# Patient Record
Sex: Female | Born: 1975 | Race: Black or African American | Hispanic: No | Smoking: Current every day smoker
Health system: Southern US, Community
[De-identification: ages and names within clinical notes are randomized; demographics above are authoritative.]

## PROBLEM LIST (undated history)

## (undated) DIAGNOSIS — I10 Essential (primary) hypertension: Secondary | ICD-10-CM

## (undated) HISTORY — PX: HERNIA REPAIR: SHX51

---

## 2001-04-29 ENCOUNTER — Encounter: Payer: Self-pay | Admitting: *Deleted

## 2001-04-29 ENCOUNTER — Emergency Department (HOSPITAL_COMMUNITY): Admission: EM | Admit: 2001-04-29 | Discharge: 2001-04-29 | Payer: Self-pay | Admitting: *Deleted

## 2001-10-06 ENCOUNTER — Encounter: Payer: Self-pay | Admitting: Emergency Medicine

## 2001-10-06 ENCOUNTER — Emergency Department (HOSPITAL_COMMUNITY): Admission: EM | Admit: 2001-10-06 | Discharge: 2001-10-06 | Payer: Self-pay | Admitting: Emergency Medicine

## 2001-10-27 ENCOUNTER — Inpatient Hospital Stay (HOSPITAL_COMMUNITY): Admission: AD | Admit: 2001-10-27 | Discharge: 2001-10-30 | Payer: Self-pay | Admitting: Obstetrics & Gynecology

## 2001-10-28 ENCOUNTER — Encounter: Payer: Self-pay | Admitting: Obstetrics & Gynecology

## 2002-04-29 ENCOUNTER — Emergency Department (HOSPITAL_COMMUNITY): Admission: EM | Admit: 2002-04-29 | Discharge: 2002-04-29 | Payer: Self-pay | Admitting: Emergency Medicine

## 2005-08-15 ENCOUNTER — Emergency Department (HOSPITAL_COMMUNITY): Admission: EM | Admit: 2005-08-15 | Discharge: 2005-08-15 | Payer: Self-pay | Admitting: Emergency Medicine

## 2005-11-04 ENCOUNTER — Emergency Department (HOSPITAL_COMMUNITY): Admission: EM | Admit: 2005-11-04 | Discharge: 2005-11-04 | Payer: Self-pay | Admitting: Emergency Medicine

## 2005-12-01 ENCOUNTER — Emergency Department (HOSPITAL_COMMUNITY): Admission: EM | Admit: 2005-12-01 | Discharge: 2005-12-01 | Payer: Self-pay | Admitting: Emergency Medicine

## 2005-12-13 ENCOUNTER — Emergency Department (HOSPITAL_COMMUNITY): Admission: EM | Admit: 2005-12-13 | Discharge: 2005-12-14 | Payer: Self-pay | Admitting: Emergency Medicine

## 2006-05-08 ENCOUNTER — Observation Stay (HOSPITAL_COMMUNITY): Admission: RE | Admit: 2006-05-08 | Discharge: 2006-05-09 | Payer: Self-pay | Admitting: Obstetrics and Gynecology

## 2006-05-29 ENCOUNTER — Observation Stay (HOSPITAL_COMMUNITY): Admission: AD | Admit: 2006-05-29 | Discharge: 2006-05-30 | Payer: Self-pay | Admitting: Obstetrics and Gynecology

## 2006-06-17 ENCOUNTER — Inpatient Hospital Stay (HOSPITAL_COMMUNITY): Admission: RE | Admit: 2006-06-17 | Discharge: 2006-06-19 | Payer: Self-pay | Admitting: Obstetrics and Gynecology

## 2006-06-17 ENCOUNTER — Encounter (INDEPENDENT_AMBULATORY_CARE_PROVIDER_SITE_OTHER): Payer: Self-pay | Admitting: *Deleted

## 2007-02-15 ENCOUNTER — Emergency Department (HOSPITAL_COMMUNITY): Admission: EM | Admit: 2007-02-15 | Discharge: 2007-02-15 | Payer: Self-pay | Admitting: Emergency Medicine

## 2007-02-18 ENCOUNTER — Emergency Department (HOSPITAL_COMMUNITY): Admission: EM | Admit: 2007-02-18 | Discharge: 2007-02-18 | Payer: Self-pay | Admitting: Emergency Medicine

## 2008-01-10 ENCOUNTER — Emergency Department (HOSPITAL_COMMUNITY): Admission: EM | Admit: 2008-01-10 | Discharge: 2008-01-10 | Payer: Self-pay | Admitting: Emergency Medicine

## 2009-03-14 ENCOUNTER — Emergency Department (HOSPITAL_COMMUNITY): Admission: EM | Admit: 2009-03-14 | Discharge: 2009-03-14 | Payer: Self-pay | Admitting: Emergency Medicine

## 2009-07-01 ENCOUNTER — Emergency Department (HOSPITAL_COMMUNITY): Admission: EM | Admit: 2009-07-01 | Discharge: 2009-07-01 | Payer: Self-pay | Admitting: Emergency Medicine

## 2010-01-17 ENCOUNTER — Emergency Department (HOSPITAL_COMMUNITY): Admission: EM | Admit: 2010-01-17 | Discharge: 2010-01-17 | Payer: Self-pay | Admitting: Emergency Medicine

## 2010-12-19 LAB — CBC
Hemoglobin: 12.1 g/dL (ref 12.0–15.0)
RBC: 4.82 MIL/uL (ref 3.87–5.11)

## 2010-12-19 LAB — BASIC METABOLIC PANEL
CO2: 27 mEq/L (ref 19–32)
Chloride: 105 mEq/L (ref 96–112)
GFR calc Af Amer: >60 mL/min (ref >60)
Potassium: 3.8 mEq/L (ref 3.5–5.1)
Sodium: 138 mEq/L (ref 135–145)

## 2010-12-19 LAB — DIFFERENTIAL
Basophils Absolute: 0.1 10*3/uL (ref 0.0–0.1)
Eosinophils Relative: 2 % (ref 0–5)
Neutro Abs: 4.4 10*3/uL (ref 1.7–7.7)
Neutrophils Relative %: 61 % (ref 43–77)

## 2010-12-19 LAB — POCT CARDIAC MARKERS

## 2010-12-29 ENCOUNTER — Emergency Department (HOSPITAL_COMMUNITY)
Admission: EM | Admit: 2010-12-29 | Discharge: 2010-12-29 | Disposition: A | Discharge status: Home / Self Care | Payer: 59 | Attending: Emergency Medicine | Admitting: Emergency Medicine

## 2010-12-29 ENCOUNTER — Emergency Department (HOSPITAL_COMMUNITY): Payer: 59

## 2010-12-29 DIAGNOSIS — R0602 Shortness of breath: Secondary | ICD-10-CM | POA: Insufficient documentation

## 2010-12-29 DIAGNOSIS — J189 Pneumonia, unspecified organism: Secondary | ICD-10-CM | POA: Insufficient documentation

## 2010-12-29 DIAGNOSIS — R112 Nausea with vomiting, unspecified: Secondary | ICD-10-CM | POA: Insufficient documentation

## 2010-12-29 DIAGNOSIS — R071 Chest pain on breathing: Secondary | ICD-10-CM | POA: Insufficient documentation

## 2010-12-29 DIAGNOSIS — IMO0001 Reserved for inherently not codable concepts without codable children: Secondary | ICD-10-CM | POA: Insufficient documentation

## 2010-12-29 LAB — DIFFERENTIAL
Lymphs Abs: 1.4 10*3/uL (ref 0.7–4.0)
Monocytes Absolute: 1.9 10*3/uL — ABNORMAL HIGH (ref 0.1–1.0)
Monocytes Relative: 13 % — ABNORMAL HIGH (ref 3–12)
Neutro Abs: 11.5 10*3/uL — ABNORMAL HIGH (ref 1.7–7.7)
Neutrophils Relative %: 78 % — ABNORMAL HIGH (ref 43–77)

## 2010-12-29 LAB — BASIC METABOLIC PANEL
Chloride: 102 mEq/L (ref 96–112)
GFR calc Af Amer: >60 mL/min (ref >60)
Sodium: 134 mEq/L — ABNORMAL LOW (ref 135–145)

## 2010-12-29 LAB — POCT CARDIAC MARKERS: Myoglobin, poc: 28.4 ng/mL (ref 12–200)

## 2010-12-29 LAB — CBC
HCT: 34.1 % — ABNORMAL LOW (ref 36.0–46.0)
Hemoglobin: 11.1 g/dL — ABNORMAL LOW (ref 12.0–15.0)
MCH: 24.5 pg — ABNORMAL LOW (ref 26.0–34.0)
MCHC: 32.6 g/dL (ref 30.0–36.0)
MCV: 75.3 fL — ABNORMAL LOW (ref 78.0–100.0)
RDW: 13.8 % (ref 11.5–15.5)

## 2010-12-29 MED ORDER — IOHEXOL 300 MG/ML  SOLN
80.0000 mL | Freq: Once | INTRAMUSCULAR | Status: AC | PRN
  Administered 2010-12-29: 80 mL via INTRAVENOUS

## 2010-12-30 ENCOUNTER — Emergency Department (HOSPITAL_COMMUNITY)
Admission: EM | Admit: 2010-12-30 | Discharge: 2010-12-30 | Disposition: A | Discharge status: Home / Self Care | Payer: 59 | Attending: Emergency Medicine | Admitting: Emergency Medicine

## 2010-12-30 DIAGNOSIS — J189 Pneumonia, unspecified organism: Secondary | ICD-10-CM | POA: Insufficient documentation

## 2010-12-30 DIAGNOSIS — IMO0001 Reserved for inherently not codable concepts without codable children: Secondary | ICD-10-CM | POA: Insufficient documentation

## 2010-12-30 DIAGNOSIS — R509 Fever, unspecified: Secondary | ICD-10-CM | POA: Insufficient documentation

## 2011-01-01 ENCOUNTER — Telehealth: Payer: Self-pay | Admitting: Family Medicine

## 2011-01-01 NOTE — Telephone Encounter (Signed)
Mom called because she is worried about her daughter.  Has had persistent fevers since Thursday.  Taken to [facility] ED on Friday, diagnosed with PNA by CXR and confirmed with CT scan.  Given Rocephin in ED, prescribed Z-pack and sent home.  Fevers to 103 in ED. Fevers persisted and patient returned to ED Sat night.  Given IV fluids, morphine, and passed PO fluid challenge, so sent home.   More fevers last night, Mother still worried, took patient to [facility] in [place] where she was given more IV fluids, another unknown Abx via IV, and sent home b/c she could tolerate PO.  Given Phenergan for nausea. Mom states that today patient has been dry-heaving, but able to tolerate fluids and some potatoes to eat.   Mom still concerned b/c Fevers have not abated with anti-pyretics (Ibuprofen and Tylenol po at home) and Abx.   As patient is now tolerating po food and liquids, recommended she does not need to be seen in ED but should be seen in clinic first thing tomorrow AM.   Discussed warnings/red flags, if patient cannot tolerate po intake she needs to come to ED to be assessed.   Mom agreed and appreciated input.  Will call if further concerns, plans to call clinic first thing in AM.    * On further record review, I am note sure that we have actually seen this patient in Clinic.  Mother states she is a patient of Dr. Martin Majestic, but I cannot find her name in Epic or looking back in [facility] either.  Will alert admin team to this.

## 2011-01-04 ENCOUNTER — Other Ambulatory Visit: Payer: Self-pay | Admitting: Family Medicine

## 2011-01-08 LAB — URINALYSIS, ROUTINE W REFLEX MICROSCOPIC
Bilirubin Urine: NEGATIVE
Nitrite: NEGATIVE
Protein, ur: NEGATIVE mg/dL
Urobilinogen, UA: 0.2 mg/dL (ref 0.0–1.0)

## 2011-01-08 LAB — URINE MICROSCOPIC-ADD ON

## 2011-02-16 NOTE — H&P (Signed)
[facility]  Patient:    Katherine Ward, Katherine Ward Visit Number: [id number] MRN: [id number]          Service Type: OBS Location: 4A A427 01 Attending Physician:  Lazaro Arms Dictated by:   Duane Lope, M.D. Admit Date:  10/27/2001                           History and Physical  DATE OF BIRTH:  09-Aug-1976  HISTORY OF PRESENT ILLNESS:  The patient is a 35 year old African-American female, gravida 3, para 2, abortus 1, with last menstrual period of October 04, 2001, on no form of birth control.  She is admitted to the hospital for intravenous antibiotics for pelvic inflammatory disease.  The patient originally presented to the ER on October 06, 2001, complaining of lower abdominal pain, mostly on the right, no real fever, no nausea and vomiting. An ultrasound was performed which revealed a mass in the right adnexa which measured 1.8 x 1.4 x 2.1 cm, and the thought was this could represent a hydrosalpinx.  There was also some free fluid in the right adnexal region in the cul-de-sac.  Her pelvic ultrasound was otherwise normal.  She was seen in the office by Dr. Emelda Fear on the same day and, at this point she had already had a ______ probe done.  She had a chlamydia and gonorrhea negative. Another one was performed in the office, and that was also negative.  She had some improvement in her pain since then but has basically carried on since that time with pain.  Her white count was 13,600, with 80% neutrophils, with no bands.  The rest of her laboratory data was normal.  Dr. Emelda Fear saw her a couple of days later on October 10, 2001, and she had gotten some better.  She had a repeat ultrasound here in the office, and there were no abnormal findings on ultrasound, and she was afebrile.  Beginning last Thursday, the patient had a significant increase in her pain, and she has also been running some fever.  She comes into the office today partially bent over, walking  very slowly, with an elevated temperature of 99.7.  Urinalysis is contaminated, and no other laboratories are drawn.  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  PAST MEDICAL HISTORY:  Negative.  PAST SURGICAL HISTORY:  C-section in 1995, with successful VBAC in 1997.  MEDICATIONS:  None.  ALLERGIES:  None.  REVIEW OF SYSTEMS:  Noncontributory except as above.  FAMILY HISTORY:  Noncontributory.  PHYSICAL EXAMINATION:  VITAL SIGNS:  Weight 146 pounds.  Blood pressure 110/80.  HEENT:  Unremarkable.  NECK:  Normal thyroid.  LUNGS:  Clear.  HEART:  Regular rate and rhythm.  Without murmur, regurgitation, or gallop.  BREASTS:  Deferred.  ABDOMEN:  Reveals tenderness to palpation with involuntary guarding in the right lower quadrant.  Negative rebound, but she does have tenderness also in the right upper quadrant and the right flank with voluntary and involuntary guarding.  The left adnexa is basically negative.  PELVIC:  Reveals a fullness in the right side and quite tender.  There is cervical motion tenderness, and the left adnexa is also benign.  EXTREMITIES:  Warm.  No edema.  NEUROLOGIC:  Grossly intact.  IMPRESSION: 1. Pelvic inflammatory disease. 2. Possible tubo-ovarian abscess/complex. 3. 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  PLAN:  The patient is admitted for IV antibiotics, hydration.  She will get a CT scan of the abdomen and pelvis tomorrow.  She understands that there is a possibility that, in the next 72 hours or so if she does not respond, she may need to have a laparoscopy performed.  She was started on Rocephin and doxycycline and given Darvocet for pain. Dictated by:   Duane Lope, M.D. Attending Physician:  Lazaro Arms DD:  10/27/01 TD:  10/27/01 Job: [id number]

## 2011-02-16 NOTE — Discharge Summary (Signed)
NAME:  Katherine Ward, Katherine Ward                  ACCOUNT NO.:  [id number]   MEDICAL RECORD NO.:  [id number]          PATIENT TYPE:  INP   LOCATION:  A413                          FACILITY:  [facility]   PHYSICIAN:  Lazaro Arms, M.D.   DATE OF BIRTH:  [birth year]   DATE OF ADMISSION:  06/17/2006  DATE OF DISCHARGE:  09/19/2007LH                                 DISCHARGE SUMMARY   DISCHARGE DIAGNOSES:  1. Status post repeat cesarean section.  2. Tubal ligation.  3. Unremarkable postoperative course.   PROCEDURE:  Repeat cesarean section and tubal ligation.   HISTORY OF PRESENT ILLNESS:  Please refer to the transcribed History and  Physical on antepartum chart for details on admission to the hospital.   HOSPITAL COURSE:  The patient was admitted for C-section on September 17.  Postoperatively, she did well.  She had clear liquids and regular diet.  She  voided without symptoms and was ambulatory.  She had progression of normal  bowel function.  Her abdominal exam was benign.  Her incisions were clean,  dry and intact.  Her postop day #1 hemoglobin and hematocrit was 10.5 and  31.7 which was appropriate for her preop level.  She was discharged to home  on the morning of June 19, 2006, in good and stable condition.   DISCHARGE MEDICATIONS:  Tylenol No. 3 elixir as needed.   FOLLOW UP:  She will follow up in the office next Monday to have her staples  removed.      Lazaro Arms, M.D.  Electronically Signed     LHE/MEDQ  D:  06/19/2006  T:  06/20/2006  Job:  [id number]

## 2011-02-16 NOTE — Discharge Summary (Signed)
[facility]  Patient:    Katherine Ward, Katherine Ward Visit Number: [id number] MRN: [id number]          Service Type: OBS Location: 4A A427 01 Attending Physician:  Lazaro Arms Admit Date:  10/27/2001 Discharge Date: 10/30/2001                             Discharge Summary  DISCHARGE DIAGNOSES: 1. Pelvic inflammatory disease. 2. 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  PROCEDURES: 1. Admission to the hospital. 2. Intravenous antibiotics. 3. Discharge from hospital.  HISTORY OF PRESENT ILLNESS:  Please refer to the transcribed history and physical for details of admission to the hospital.  HOSPITAL COURSE:  Patient was admitted and immediately spiked a temperature to 101.5 degrees Fahrenheit.  She had a relatively unimpressive white count at 9100, but a significant left shift.  She underwent a CT scan of the abdomen and pelvis the day after admission.  She was, of course, started on Rocephin and doxycycline at the time of admission.  She was found to have what appears to be a chronic, judging by the ultrasound she had on January 6, a hydrosalpinx on the right and she had some obliteration of the fat planes on the right side.  The left adnexa appeared to be normal.  She also had questionable changes consistent with pyelonephritis which she has had in the past.  Urine culture at the time of discharge is negative and examination revealed no CVA tenderness.  The patient, after about 36 hours, felt significantly better.  I was concerned at the time of admission that she may require laparoscopic evaluation and possibly removal of the adnexa when I admitted her because of her marked tenderness and she did have some rebound in the hospital but after about 36 hours her pain was significantly better and her white count came down to 7600.  She also got her appetite back and ate without difficulty, ambulated without difficulty, and she required really no pain medicine.  She got  a total of 72 hours of IV antibiotics.  She was discharged to home after having received p.o. doxycycline and did well and Rocephin 1 g intramuscularly and she did well with that.  She was discharged to home on doxycycline 100 mg b.i.d. and she will be seen in the office next Wednesday for follow-up.  The only prescription she was given was Chromagen for her slight iron deficiency anemia and her doxycycline. Attending Physician:  Lazaro Arms DD:  10/30/01 TD:  10/30/01 Job: [id number]

## 2011-02-16 NOTE — H&P (Signed)
NAME:  Katherine Ward, Katherine Ward                  ACCOUNT NO.:  [id number]   MEDICAL RECORD NO.:  [id number]          PATIENT TYPE:  OIB   LOCATION:  A415                          FACILITY:  [facility]   PHYSICIAN:  Tilda Burrow, M.D. DATE OF BIRTH:  09/20/1976   DATE OF ADMISSION:  05/08/2006  DATE OF DISCHARGE:  LH                                HISTORY & PHYSICAL   HISTORY OF PRESENT ILLNESS:  Katherine Ward is a 35 year old gravida 3, para 2, [facility]  September 26 at 30-weeks' gestation with right flank pain and suprapubic  pressure.   HISTORY OF PRESENT ILLNESS:  Katherine Ward became very uncomfortable at home. She  presents to the hospital rating her pain on a scale of 1/10.   MEDICAL HISTORY:  Positive for abnormal Pap and a heart murmur.   SOCIAL HISTORY:  Positive for cesarean section and a hernia repair.   ALLERGIES:  She has no known allergies.   PRENATAL COURSE:  Has been essentially uneventful. Blood type is O positive,  EDS is positive for THC, rubella is equivocal. Hepatitis B surface antigen  negative. HIV is negative. Serology is nonreactive. Pap normal. AFP within  normal limits.   PHYSICAL EXAMINATION:  She urinates clear with the exception she has blood  in her urine. Cervix is closed. There are no contractions noted on the  monitor.   PLAN:  We are going to get a renal ultrasound, strain all urine, consult Dr.  Emelda Fear and IV pain management until results are back from renal  ultrasound.      Katherine Ward, Katherine Ward      Tilda Burrow, M.D.  Electronically Signed    DL/MEDQ  D:  25/36/6440  T:  05/08/2006  Job:  [id number]

## 2011-02-16 NOTE — Op Note (Signed)
NAME:  Katherine Ward, Katherine Ward                  ACCOUNT NO.:  [id number]   MEDICAL RECORD NO.:  [id number]          PATIENT TYPE:  INP   LOCATION:  A413                          FACILITY:  [facility]   PHYSICIAN:  Tilda Burrow, M.D. DATE OF BIRTH:  June 10, 1976   DATE OF PROCEDURE:  06/17/2006  DATE OF DISCHARGE:                                 OPERATIVE REPORT   PREOPERATIVE DIAGNOSES:  1. Pregnancy at 38+ weeks gestation.  2. Repeat cesarean section after trial of labor.  3. Elective permanent sterilization.   POSTOPERATIVE DIAGNOSES:  1. Pregnancy at 38+ weeks gestation.  2. Repeat cesarean section after trial of labor.  3. Elective permanent sterilization.   PROCEDURES:  1. Repeat low transverse cesarean section.  2. Bilateral partial salpingectomy.   SURGEON:  Tilda Burrow, M.D.   ASSISTANT:  None.   ANESTHESIA:  Spinal anesthesia.   COMPLICATIONS:  None.   FINDINGS:  Healthy female infant, Apgars 9 and 9, weight__________.   INDICATIONS FOR PROCEDURE:  A 35 year old multiparous female, desiring  repeat C. section and tubal sterilization.   DESCRIPTION OF PROCEDURE:  Spinal anesthesia introduced by Glynn Octave in  uncomplicated fashion.  Patient was somewhat anxious but passed the Allis  clamp test.  Pfannenstiel incision repeated easily with easy entry into the  peritoneal cavity.  The bladder flap was developed.  The lower uterine  segment was incredibly thin, 2-3 mm thickness.  Transverse incision extended  laterally using index finger retraction and fetal vertex guided through the  incision.  A small, healthy, active, energetic baby.  Amniotic fluid was  normal in volume and color.  Cord blood samples were obtained and are  pending and located elsewhere in the chart.  Apgars 9 and 9 were assigned.  See Dr. Samul Dada notes for details.  Placenta delivered intact, [name]  presentation, there were some membrane remnants identified and extracted.  The uterus otherwise  extremely hemostatic.  Single layer of running locking  closure of 2-0 chromic with a single figure-of-eight suture on the left side  of the incision gave good hemostasis and then bladder flap was closed with  running 2-0 chromic.  The abdomen was irrigated with antibiotic solution.   TUBAL LIGATION:  Tubal ligation was performed by grasping a mid segment  knuckle of tube, doubly ligating around its base and then incising the  incarcerated segment of tube for histology and hemostasis was again  excellent.  The 2-0 chromic closure of the peritoneum was followed by  trimming of the old cicatrix off of the incision, removing a 1 to 1-1/2 inch  wide strip of skin and fibrous tissue and fatty tissue.  The fascia was  trimmed slightly as well in the midline to improve lower abdominal contours.  Fascia had been closed post trimming with 0 Vicryl in a running fashion.  Subcutaneous closure was with interrupted 2-0 plain and staple closure of  the skin completed the procedure.   ESTIMATED BLOOD LOSS:  Quite low at 250 mL.      Tilda Burrow, M.D.  Electronically Signed  JVF/MEDQ  D:  06/17/2006  T:  06/17/2006  Job:  [id number]   cc:   [facility]   Jeoffrey Massed, MD  Fax: [phone]

## 2011-02-16 NOTE — H&P (Signed)
NAME:  Katherine Ward, Katherine Ward                  ACCOUNT NO.:  [id number]   MEDICAL RECORD NO.:  [id number]          PATIENT TYPE:  AMB   LOCATION:  DAY                           FACILITY:  [facility]   PHYSICIAN:  Tilda Burrow, M.D. DATE OF BIRTH:  June 01, 1976   DATE OF ADMISSION:  06/17/2006  DATE OF DISCHARGE:  LH                                HISTORY & PHYSICAL   ADMITTING DIAGNOSES:  1. Pregnancy 38 weeks 5 days.  2. Repeat cesarean section.  3. Not for trial of labor.  4. Desire for elective permanent sterilization.   HPI:  This 35 year old female gravida 3, para 2, prior cesarean section x1  and a subsequent vaginal birth 1997, by Dr. Gilford Silvius, is admitted for repeat  cesarean section and tubal ligation.  She has been followed through our  office with an uneventful prenatal course through 14 prenatal visits,  appropriate weight gain and fundal height.  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  She plans  permanent sterilization.   PAST MEDICAL HISTORY:  Benign.   SURGICAL HISTORY:  1. C-section.  2. Umbilical hernia repair.   PRENATAL COURSE:  Was notable for questionable fetal thoracic spine on  screening ultrasound.  Referred to [facility] for level 2 ultrasound,  targeted level 2 ultrasound, which was reported as negative Feb 06, 2006.   GENERAL EXAM:  She is a healthy, slim African-American female.  Height 5  feet 6 inches, weight 197, blood pressure 124/64, fundal height 38 cm,  estimated fetal weight 7.5 pounds, cervix closed, long and high at last  check May 15, 2006.   PLAN:  Repeat C-section, tubal ligation June 17, 2006.      Tilda Burrow, M.D.  Electronically Signed     JVF/MEDQ  D:   06/11/2006  T:  06/11/2006  Job:  [id number]   cc:   [facility]  [address], [address]  [place], [place] [address]   Francoise Schaumann. Milford Cage DO, FAAP  Fax: [phone]

## 2011-02-16 NOTE — Discharge Summary (Signed)
NAME:  Ward, Katherine                  ACCOUNT NO.:  [id number]   MEDICAL RECORD NO.:  [id number]          PATIENT TYPE:  OIB   LOCATION:  LDR2                          FACILITY:  [facility]   PHYSICIAN:  Tilda Burrow, M.D. DATE OF BIRTH:  1976/07/02   DATE OF ADMISSION:  05/29/2006  DATE OF DISCHARGE:  08/30/2007LH                                 DISCHARGE SUMMARY   REASON FOR ADMISSION:  [name] is a 35 year old patient with previous C-  section who is in with lower abdominal pain and cramping.   PAST MEDICAL HISTORY:  Medical history is positive for ovarian cyst.   PAST SURGICAL HISTORY:  Positive for C-section and umbilical hernia repair.   PRENATAL COURSE:  Essentially uneventful.  Blood type is A positive.  Rubella is equivocal.  Hepatitis B surface antigen is negative.  HIV is  negative.  _rubella_ is positive.  Serology is nonreactive.  Pap normal.  ________ normal.  Hemoglobin 10.1, hematocrit 32.8.  One hour glucose is 92.   PHYSICAL EXAMINATION:  Vital signs are stable.  Fetal heart regular and  stable.  Amniostat is negative.   HOSPITAL COURSE:  There has been no leaking noted since admission last  night.  She is having an occasional isolated contraction.  Dr. Carey Bullocks was  in to evaluate the patient and deem the patient ready for discharge home.      [name], [name]      Tilda Burrow, M.D.  Electronically Signed    DL/MEDQ  D:  56/43/3295  T:  05/30/2006  Job:  [id number]

## 2011-06-26 LAB — BASIC METABOLIC PANEL
Calcium: 9
Chloride: 109
Creatinine, Ser: 0.92
GFR calc Af Amer: >60
GFR calc non Af Amer: >60

## 2011-06-26 LAB — CBC
Platelets: 184
RBC: 5.09
WBC: 10.1

## 2011-06-26 LAB — URINE MICROSCOPIC-ADD ON

## 2011-06-26 LAB — URINALYSIS, ROUTINE W REFLEX MICROSCOPIC
Glucose, UA: NEGATIVE
Protein, ur: 100 — AB
pH: 6

## 2011-06-26 LAB — DIFFERENTIAL
Lymphocytes Relative: 28
Lymphs Abs: 2.9
Monocytes Relative: 8
Neutro Abs: 6.1
Neutrophils Relative %: 61

## 2011-06-26 LAB — PREGNANCY, URINE: Preg Test, Ur: NEGATIVE

## 2011-09-14 ENCOUNTER — Encounter: Payer: Self-pay | Admitting: *Deleted

## 2011-09-14 ENCOUNTER — Emergency Department (HOSPITAL_COMMUNITY)
Admission: EM | Admit: 2011-09-14 | Discharge: 2011-09-14 | Disposition: A | Discharge status: Home / Self Care | Payer: Managed Care, Other (non HMO) | Attending: Emergency Medicine | Admitting: Emergency Medicine

## 2011-09-14 DIAGNOSIS — R6883 Chills (without fever): Secondary | ICD-10-CM | POA: Insufficient documentation

## 2011-09-14 DIAGNOSIS — R110 Nausea: Secondary | ICD-10-CM | POA: Insufficient documentation

## 2011-09-14 DIAGNOSIS — R55 Syncope and collapse: Secondary | ICD-10-CM | POA: Insufficient documentation

## 2011-09-14 DIAGNOSIS — K429 Umbilical hernia without obstruction or gangrene: Secondary | ICD-10-CM | POA: Insufficient documentation

## 2011-09-14 DIAGNOSIS — I10 Essential (primary) hypertension: Secondary | ICD-10-CM | POA: Insufficient documentation

## 2011-09-14 DIAGNOSIS — F17200 Nicotine dependence, unspecified, uncomplicated: Secondary | ICD-10-CM | POA: Insufficient documentation

## 2011-09-14 HISTORY — DX: Essential (primary) hypertension: I10

## 2011-09-14 LAB — COMPREHENSIVE METABOLIC PANEL
ALT: 9 U/L (ref 0–35)
Alkaline Phosphatase: 61 U/L (ref 39–117)
BUN: 4 mg/dL — ABNORMAL LOW (ref 6–23)
Chloride: 100 mEq/L (ref 96–112)
GFR calc Af Amer: 87 mL/min — ABNORMAL LOW (ref >90)
Glucose, Bld: 83 mg/dL (ref 70–99)
Potassium: 3.5 mEq/L (ref 3.5–5.1)
Sodium: 136 mEq/L (ref 135–145)
Total Bilirubin: 0.5 mg/dL (ref 0.3–1.2)

## 2011-09-14 LAB — CBC
HCT: 41.4 % (ref 36.0–46.0)
MCHC: 32.1 g/dL (ref 30.0–36.0)
MCV: 76.8 fL — ABNORMAL LOW (ref 78.0–100.0)
Platelets: 245 10*3/uL (ref 150–400)
RDW: 14.6 % (ref 11.5–15.5)
WBC: 9.8 10*3/uL (ref 4.0–10.5)

## 2011-09-14 LAB — DIFFERENTIAL
Basophils Absolute: 0 10*3/uL (ref 0.0–0.1)
Basophils Relative: 0 % (ref 0–1)
Eosinophils Relative: 1 % (ref 0–5)
Lymphocytes Relative: 20 % (ref 12–46)
Monocytes Absolute: 0.9 10*3/uL (ref 0.1–1.0)
Neutro Abs: 6.9 10*3/uL (ref 1.7–7.7)

## 2011-09-14 MED ORDER — LORAZEPAM 2 MG/ML IJ SOLN
1.0000 mg | Freq: Once | INTRAMUSCULAR | Status: AC
  Administered 2011-09-14: 1 mg via INTRAVENOUS
  Filled 2011-09-14: qty 1

## 2011-09-14 MED ORDER — HYDROMORPHONE HCL PF 1 MG/ML IJ SOLN
1.0000 mg | Freq: Once | INTRAMUSCULAR | Status: AC
  Administered 2011-09-14: 1 mg via INTRAVENOUS
  Filled 2011-09-14: qty 1

## 2011-09-14 MED ORDER — SODIUM CHLORIDE 0.9 % IV SOLN
Freq: Once | INTRAVENOUS | Status: AC
  Administered 2011-09-14: 125 mL/h via INTRAVENOUS

## 2011-09-14 MED ORDER — ONDANSETRON HCL 4 MG/2ML IJ SOLN
4.0000 mg | Freq: Once | INTRAMUSCULAR | Status: AC
  Administered 2011-09-14: 4 mg via INTRAVENOUS
  Filled 2011-09-14: qty 2

## 2011-09-14 NOTE — ED Notes (Signed)
C/o epigastric pain that started yesterday.  Denies v/d, c/o nausea.  Pt has umbilical hernia.  Pt also states has sharp pains that shoot to chest intermittently.  States she had syncopal episode last night while at work but does not remember.

## 2011-09-14 NOTE — ED Provider Notes (Signed)
History   This chart was scribed for Katherine Lennert, MD by Clarita Crane. The patient was seen in room APA06/APA06 and the patient's care was started at 9:50AM.   CSN: [id number] Arrival date & time: 09/14/2011  9:26 AM   First MD Initiated Contact with Patient 09/14/11 0946      Chief Complaint  Patient presents with  . Abdominal Pain    (Consider location/radiation/quality/duration/timing/severity/associated sxs/prior treatment) HPI Katherine Ward is a 35 y.o. female who presents to the Emergency Department complaining of constant severe pain in the umbilical region since yesterday. Patient reports having a bulge in the umbilical region since yesterday. She has associated nausea, chills, and reportedly passed out at work yesterday. She denies vomiting and diarrhea. Patient has a h/o a hernia for the past year, which was surgical repaired previously.   Past Medical History  Diagnosis Date  . Hypertension     Past Surgical History  Procedure Date  . Hernia repair   . Cesarean section     No family history on file.  History  Substance Use Topics  . Smoking status: Current Everyday Smoker    Types: Cigarettes  . Smokeless tobacco: Not on file  . Alcohol Use: No    OB History    Grav Para Term Preterm Abortions TAB SAB Ect Mult Living                  Review of Systems  Constitutional: Positive for chills. Negative for fever and fatigue.  HENT: Negative for congestion, sinus pressure and ear discharge.   Eyes: Negative for discharge.  Respiratory: Negative for cough.   Cardiovascular: Negative for chest pain.  Gastrointestinal: Positive for nausea and abdominal pain. Negative for vomiting and diarrhea.  Genitourinary: Negative for frequency and hematuria.  Musculoskeletal: Negative for back pain.  Skin: Negative for rash.  Neurological: Negative for seizures and headaches.  Hematological: Negative.   Psychiatric/Behavioral: Negative for hallucinations.     Allergies  Review of patient's allergies indicates no known allergies.  Home Medications   Current Outpatient Rx  Name Route Sig Dispense Refill  . LISINOPRIL 20 MG PO TABS Oral Take 20 mg by mouth daily.        BP 110/78  Pulse 54  Temp(Src) 98.4 F (36.9 C) (Oral)  Resp 20  Ht 5\' 4"  (1.626 m)  Wt 169 lb (76.658 kg)  BMI 29.01 kg/m2  SpO2 97%  LMP 08/29/2011  Physical Exam  Nursing note and vitals reviewed. Constitutional: She is oriented to person, place, and time. She appears well-developed and well-nourished. She appears distressed (moderate).  HENT:  Head: Normocephalic and atraumatic.  Eyes: EOM are normal. Pupils are equal, round, and reactive to light.  Neck: Neck supple. No tracheal deviation present.  Cardiovascular: Normal rate, regular rhythm and normal heart sounds.  Exam reveals no gallop and no friction rub.   No murmur heard. Pulmonary/Chest: Effort normal. No respiratory distress.  Abdominal: Soft. She exhibits no distension. There is tenderness (tender to palpatation ).       2 cm in diameter hernia in umbilical region, not easily reducible  Musculoskeletal: Normal range of motion. She exhibits no edema.  Neurological: She is alert and oriented to person, place, and time. No sensory deficit.  Skin: Skin is warm and dry.  Psychiatric: She has a normal mood and affect. Her behavior is normal.    ED Course  Procedures (including critical care time)  DIAGNOSTIC STUDIES: Oxygen Saturation is 100%  on room air, normal by my interpretation.    COORDINATION OF CARE:  10:25am: Recheck-reduced umbilical hernia after sedation    Hernia was reduced with manuel pressure after sedation given.   11:30am: Recheck: minimally tender to periumbilcal region  Labs Reviewed  COMPREHENSIVE METABOLIC PANEL - Abnormal; Notable for the following:    BUN 4 (*)    GFR calc non Af Amer 75 (*)    GFR calc Af Amer 87 (*)    All other components within normal limits   CBC - Abnormal; Notable for the following:    RBC 5.39 (*)    MCV 76.8 (*)    MCH 24.7 (*)    All other components within normal limits  DIFFERENTIAL   No results found. Dr zieglar stated he will see the pt next week  1. Umbilical hernia       MDM     The chart was scribed for me under my direct supervision.  I personally performed the history, physical, and medical decision making and all procedures in the evaluation of this patient.Katherine Lennert, MD 09/14/11 [phone]

## 2012-01-29 ENCOUNTER — Emergency Department (HOSPITAL_COMMUNITY): Payer: Managed Care, Other (non HMO)

## 2012-01-29 ENCOUNTER — Encounter (HOSPITAL_COMMUNITY): Payer: Self-pay

## 2012-01-29 ENCOUNTER — Other Ambulatory Visit: Payer: Self-pay

## 2012-01-29 ENCOUNTER — Emergency Department (HOSPITAL_COMMUNITY)
Admission: EM | Admit: 2012-01-29 | Discharge: 2012-01-29 | Disposition: A | Discharge status: Home / Self Care | Payer: Managed Care, Other (non HMO) | Attending: Emergency Medicine | Admitting: Emergency Medicine

## 2012-01-29 DIAGNOSIS — R079 Chest pain, unspecified: Secondary | ICD-10-CM | POA: Insufficient documentation

## 2012-01-29 LAB — TROPONIN I: Troponin I: <0.3 ng/mL (ref <0.30)

## 2012-01-29 LAB — D-DIMER, QUANTITATIVE (NOT AT ARMC): D-Dimer, Quant: 0.35 ug/mL-FEU (ref 0.00–0.48)

## 2012-01-29 MED ORDER — ASPIRIN 81 MG PO CHEW
324.0000 mg | CHEWABLE_TABLET | Freq: Once | ORAL | Status: AC
  Administered 2012-01-29: 324 mg via ORAL
  Filled 2012-01-29: qty 4

## 2012-01-29 MED ORDER — SODIUM CHLORIDE 0.9 % IV BOLUS (SEPSIS)
1000.0000 mL | Freq: Once | INTRAVENOUS | Status: AC
  Administered 2012-01-29: 1000 mL via INTRAVENOUS

## 2012-01-29 MED ORDER — KETOROLAC TROMETHAMINE 30 MG/ML IJ SOLN
15.0000 mg | Freq: Once | INTRAMUSCULAR | Status: AC
  Administered 2012-01-29: 15 mg via INTRAVENOUS
  Filled 2012-01-29: qty 1

## 2012-01-29 NOTE — ED Notes (Signed)
Patient with no complaints at this time. Respirations even and unlabored. Skin warm/dry. Discharge instructions reviewed with patient at this time. Patient given opportunity to voice concerns/ask questions. IV removed per policy and band-aid applied to site. Patient discharged at this time and left Emergency Department with steady gait.  

## 2012-01-29 NOTE — ED Notes (Signed)
Pt reports cp that started last night around 12am, pain worse w/ movement. Sob at times, denies any n/v or cough.  Tearful in exam room

## 2012-01-29 NOTE — Discharge Instructions (Signed)
Chest Pain (Nonspecific) It is often hard to give a specific diagnosis for the cause of chest pain. There is always a chance that your pain could be related to something serious, such as a heart attack or a blood clot in the lungs. You need to follow up with your caregiver for further evaluation. CAUSES   Heartburn.   Pneumonia or bronchitis.   Anxiety or stress.   Inflammation around your heart (pericarditis) or lung (pleuritis or pleurisy).   A blood clot in the lung.   A collapsed lung (pneumothorax). It can develop suddenly on its own (spontaneous pneumothorax) or from injury (trauma) to the chest.   Shingles infection (herpes zoster virus).  The chest wall is composed of bones, muscles, and cartilage. Any of these can be the source of the pain.  The bones can be bruised by injury.   The muscles or cartilage can be strained by coughing or overwork.   The cartilage can be affected by inflammation and become sore (costochondritis).  DIAGNOSIS  Lab tests or other studies, such as X-rays, electrocardiography, stress testing, or cardiac imaging, may be needed to find the cause of your pain.  TREATMENT   Treatment depends on what may be causing your chest pain. Treatment may include:   Acid blockers for heartburn.   Anti-inflammatory medicine.   Pain medicine for inflammatory conditions.   Antibiotics if an infection is present.   You may be advised to change lifestyle habits. This includes stopping smoking and avoiding alcohol, caffeine, and chocolate.   You may be advised to keep your head raised (elevated) when sleeping. This reduces the chance of acid going backward from your stomach into your esophagus.   Most of the time, nonspecific chest pain will improve within 2 to 3 days with rest and mild pain medicine.  HOME CARE INSTRUCTIONS   If antibiotics were prescribed, take your antibiotics as directed. Finish them even if you start to feel better.   For the next few  days, avoid physical activities that bring on chest pain. Continue physical activities as directed.   Do not smoke.   Avoid drinking alcohol.   Only take over-the-counter or prescription medicine for pain, discomfort, or fever as directed by your caregiver.   Follow your caregiver's suggestions for further testing if your chest pain does not go away.   Keep any follow-up appointments you made. If you do not go to an appointment, you could develop lasting (chronic) problems with pain. If there is any problem keeping an appointment, you must call to reschedule.  SEEK MEDICAL CARE IF:   You think you are having problems from the medicine you are taking. Read your medicine instructions carefully.   Your chest pain does not go away, even after treatment.   You develop a rash with blisters on your chest.  SEEK IMMEDIATE MEDICAL CARE IF:   You have increased chest pain or pain that spreads to your arm, neck, jaw, back, or abdomen.   You develop shortness of breath, an increasing cough, or you are coughing up blood.   You have severe back or abdominal pain, feel nauseous, or vomit.   You develop severe weakness, fainting, or chills.   You have a fever.  THIS IS AN EMERGENCY. Do not wait to see if the pain will go away. Get medical help at once. Call your local emergency services (911 in [place] Do not drive yourself to the hospital. MAKE SURE YOU:   Understand these instructions.     Will watch your condition.   Will get help right away if you are not doing well or get worse.  Document Released: 06/27/2005 Document Revised: 09/06/2011 Document Reviewed: 04/22/2008 ExitCare Patient Information 2012 [employer]  RESOURCE GUIDE  Dental Problems  Patients with Medicaid: [facility]                     [facility] [address]                                           [address] Phone:  [phone]                                                   Phone:  [phone]  If unable to pay or uninsured, contact:  [facility] or [facility] to become qualified for the adult dental clinic.  Chronic Pain Problems Contact [facility]  [phone] Patients need to be referred by their primary care doctor.  Insufficient Money for Medicine Contact [employer]:  call "211" or [facility] 271-5999.  No Primary Care Doctor Call [facility]  832-8000 Other agencies that provide inexpensive medical care    [facility]  [phone]    [facility]  [phone]    [facility]  [phone]    [facility]  [phone]    [facility]  [phone]    [facility]  [phone]  [facility]  [phone] [facility]  [phone] [facility]   800 853-5163 (emergency services 641-4993)  Substance Abuse Resources [facility]  336-882-2125 [facility] 336-784-9470 [facility] 336-285-9073 [facility] 336-845-3988 Residential & Outpatient Substance Abuse Program  800-659-3381  Abuse/Neglect [place] Child Abuse Hotline (336) 641-3795 [place] Child Abuse Hotline 800-378-5315 (After Hours)  Emergency Shelter [facility] (336) 271-5985  [facility] (336) 275-9566 [facility] (704) 372-4663  MRSA Hotline #:   832-7006    [place] [facility]  [facility] [place]     [employer]                          [facility] [address] [place]                       [address]      [address]  [place]                                                [place]                            [place] Phone:  [phone]                                     Phone:  [phone]                 Phone:  [phone]  [facility] Phone:   [phone]  [place] Child Abuse Hotline (336) 342-1394 (336) 342-3537 (After Hours)   

## 2012-01-29 NOTE — ED Provider Notes (Signed)
History    36 year old female with chest pain. Gradual onset last night around midnight. Denies trauma. Relatively constant since onset. No radiation. Pain is worse with movements. No pleuritic component. No fevers or chills. No shortness of breath. No nausea or vomiting. No diaphoresis. No palpitations. No unusual leg pain. Denies history of blood clot. No rash. CSN: [id number]  Arrival date & time 01/29/12  1210   First MD Initiated Contact with Patient 01/29/12 1256      Chief Complaint  Patient presents with  . Chest Pain    (Consider location/radiation/quality/duration/timing/severity/associated sxs/prior treatment) HPI  Past Medical History  Diagnosis Date  . Hypertension     Past Surgical History  Procedure Date  . Hernia repair   . Cesarean section     No family history on file.  History  Substance Use Topics  . Smoking status: Current Everyday Smoker    Types: Cigarettes  . Smokeless tobacco: Not on file  . Alcohol Use: No    OB History    Grav Para Term Preterm Abortions TAB SAB Ect Mult Living                  Review of Systems   Review of symptoms negative unless otherwise noted in HPI.   Allergies  Review of patient's allergies indicates no known allergies.  Home Medications   Current Outpatient Rx  Name Route Sig Dispense Refill  . LISINOPRIL 20 MG PO TABS Oral Take 20 mg by mouth daily.        BP 138/78  Pulse 80  Temp(Src) 98.1 F (36.7 C) (Oral)  Resp 20  Ht 5\' 4"  (1.626 m)  Wt 169 lb (76.658 kg)  BMI 29.01 kg/m2  SpO2 100%  LMP 12/31/2011  Physical Exam  Nursing note and vitals reviewed. Constitutional: She appears well-developed and well-nourished. No distress.       Mildly anxious appearing  HENT:  Head: Normocephalic and atraumatic.  Eyes: Conjunctivae are normal. Right eye exhibits no discharge. Left eye exhibits no discharge.  Neck: Neck supple.  Cardiovascular: Normal rate, regular rhythm and normal heart sounds.   Exam reveals no gallop and no friction rub.   No murmur heard. Pulmonary/Chest: Effort normal and breath sounds normal. No respiratory distress. She exhibits no tenderness.  Abdominal: Soft. She exhibits no distension. There is no tenderness.  Musculoskeletal: She exhibits no edema and no tenderness.       Lower extremities symmetric as compared to each other. No calf tenderness. Negative Homan's. No palpable cords.   Neurological: She is alert.  Skin: Skin is warm and dry. She is not diaphoretic.  Psychiatric: She has a normal mood and affect. Her behavior is normal. Thought content normal.    ED Course  Procedures (including critical care time)   Labs Reviewed  TROPONIN I  D-DIMER, QUANTITATIVE  LAB REPORT - SCANNED   No results found.  Dg Chest 2 View  01/29/2012  *RADIOLOGY REPORT*  Clinical Data: Mid chest pain, hypertension  CHEST - 2 VIEW  Comparison: 12/29/2010  Findings: Normal heart size, mediastinal contours, and pulmonary vascularity. Lungs clear. No pleural effusion or pneumothorax. Bones unremarkable.  IMPRESSION: No acute abnormalities. Focal right perihilar opacity seen on the previous exam is no longer identified compatible with resolved dense consolidation.  Original Report Authenticated By: Lollie Marrow, M.D.   EKG:  Rhythm: Normal sinus  Rate: 74 Axis: Normal axis Intervals: Normal ST segments: Nonspecific ST changes. There is T-wave flattening inferiorly  and anteriorly. comparison: Little change from prior EKG from 12/29/2010   1. Chest pain       MDM  36 year old female with chest pain. Atypical for ACS given relatively constant nature since around midnight last night. Pain is also exacerbated with movement. EKG stable from previous. Troponin is normal. Consider pulmonary embolism but doubt. D-dimer was normal and this combined with the low clinical probability makes highly unlikely. Afebrile. Doubt infectious. Etiology unclear, but possibly  musculoskeletal with reproduction with movement. Low suspicion for emergent cause. Return precautions were discussed though. Outpatient followup otherwise.        Raeford Razor, MD 01/31/12 [phone]

## 2012-11-25 ENCOUNTER — Encounter (HOSPITAL_COMMUNITY): Payer: Self-pay

## 2012-11-25 ENCOUNTER — Emergency Department (HOSPITAL_COMMUNITY)
Admission: EM | Admit: 2012-11-25 | Discharge: 2012-11-25 | Disposition: A | Discharge status: Home / Self Care | Payer: Managed Care, Other (non HMO) | Attending: Emergency Medicine | Admitting: Emergency Medicine

## 2012-11-25 ENCOUNTER — Emergency Department (HOSPITAL_COMMUNITY): Payer: Managed Care, Other (non HMO)

## 2012-11-25 DIAGNOSIS — R197 Diarrhea, unspecified: Secondary | ICD-10-CM | POA: Insufficient documentation

## 2012-11-25 DIAGNOSIS — Z79899 Other long term (current) drug therapy: Secondary | ICD-10-CM | POA: Insufficient documentation

## 2012-11-25 DIAGNOSIS — Z9889 Other specified postprocedural states: Secondary | ICD-10-CM | POA: Insufficient documentation

## 2012-11-25 DIAGNOSIS — K5289 Other specified noninfective gastroenteritis and colitis: Secondary | ICD-10-CM | POA: Insufficient documentation

## 2012-11-25 DIAGNOSIS — R52 Pain, unspecified: Secondary | ICD-10-CM | POA: Insufficient documentation

## 2012-11-25 DIAGNOSIS — F17200 Nicotine dependence, unspecified, uncomplicated: Secondary | ICD-10-CM | POA: Insufficient documentation

## 2012-11-25 DIAGNOSIS — R1084 Generalized abdominal pain: Secondary | ICD-10-CM | POA: Insufficient documentation

## 2012-11-25 DIAGNOSIS — I10 Essential (primary) hypertension: Secondary | ICD-10-CM | POA: Insufficient documentation

## 2012-11-25 DIAGNOSIS — K529 Noninfective gastroenteritis and colitis, unspecified: Secondary | ICD-10-CM

## 2012-11-25 DIAGNOSIS — Z3202 Encounter for pregnancy test, result negative: Secondary | ICD-10-CM | POA: Insufficient documentation

## 2012-11-25 LAB — COMPREHENSIVE METABOLIC PANEL WITH GFR
ALT: 10 U/L (ref 0–35)
AST: 18 U/L (ref 0–37)
Albumin: 4 g/dL (ref 3.5–5.2)
Alkaline Phosphatase: 55 U/L (ref 39–117)
BUN: 10 mg/dL (ref 6–23)
CO2: 27 meq/L (ref 19–32)
Calcium: 9.4 mg/dL (ref 8.4–10.5)
Chloride: 101 meq/L (ref 96–112)
Creatinine, Ser: 0.95 mg/dL (ref 0.50–1.10)
GFR calc Af Amer: 90 mL/min — ABNORMAL LOW
GFR calc non Af Amer: 77 mL/min — ABNORMAL LOW
Glucose, Bld: 91 mg/dL (ref 70–99)
Potassium: 4.2 meq/L (ref 3.5–5.1)
Sodium: 135 meq/L (ref 135–145)
Total Bilirubin: 0.6 mg/dL (ref 0.3–1.2)
Total Protein: 7.5 g/dL (ref 6.0–8.3)

## 2012-11-25 LAB — CBC WITH DIFFERENTIAL/PLATELET
Basophils Absolute: 0 10*3/uL (ref 0.0–0.1)
Basophils Relative: 0 % (ref 0–1)
Eosinophils Absolute: 0.1 10*3/uL (ref 0.0–0.7)
Eosinophils Relative: 2 % (ref 0–5)
Lymphs Abs: 0.8 10*3/uL (ref 0.7–4.0)
MCH: 25 pg — ABNORMAL LOW (ref 26.0–34.0)
MCV: 76.7 fL — ABNORMAL LOW (ref 78.0–100.0)
Neutrophils Relative %: 84 % — ABNORMAL HIGH (ref 43–77)
Platelets: 258 10*3/uL (ref 150–400)
RBC: 5.32 MIL/uL — ABNORMAL HIGH (ref 3.87–5.11)
RDW: 14.3 % (ref 11.5–15.5)

## 2012-11-25 LAB — URINALYSIS, ROUTINE W REFLEX MICROSCOPIC
Glucose, UA: NEGATIVE mg/dL
Ketones, ur: NEGATIVE mg/dL
Leukocytes, UA: NEGATIVE
pH: 6 (ref 5.0–8.0)

## 2012-11-25 LAB — URINE MICROSCOPIC-ADD ON

## 2012-11-25 MED ORDER — SODIUM CHLORIDE 0.9 % IV BOLUS (SEPSIS)
1000.0000 mL | Freq: Once | INTRAVENOUS | Status: AC
  Administered 2012-11-25: 1000 mL via INTRAVENOUS

## 2012-11-25 MED ORDER — ONDANSETRON HCL 4 MG/2ML IJ SOLN
4.0000 mg | Freq: Once | INTRAMUSCULAR | Status: AC
  Administered 2012-11-25: 4 mg via INTRAVENOUS
  Filled 2012-11-25: qty 2

## 2012-11-25 MED ORDER — ONDANSETRON HCL 4 MG PO TABS: 4.0000 mg | ORAL_TABLET | Freq: Four times a day (QID) | ORAL | Status: DC

## 2012-11-25 NOTE — ED Notes (Signed)
Pt talking on cellphone

## 2012-11-25 NOTE — ED Provider Notes (Signed)
History     This chart was scribed for Glynn Octave, MD, MD by Smitty Pluck, ED Scribe. The patient was seen in room APA06/APA06 and the patient's care was started at 11:54 AM.   CSN: [id number]  Arrival date & time 11/25/12  1124      Chief Complaint  Patient presents with  . Emesis  . Diarrhea  . Generalized Body Aches   The history is provided by the patient. No language interpreter was used.   Katherine Ward is a 37 y.o. female who presents to the Emergency Department complaining of constant, moderate abdominal pain and emesis onset today 8 hours ago. The vomit has been stomach contents.  Pt reports having generalized body aches and diarrhea. She states her stools have been loose. Pt has had sick contact with son (same symptoms and seen in ED today). LMP is now. Pt denies fever, chills, hematemesis, blood in stool, chest pain, weakness, cough, SOB and any other pain. Reports hx of abdominal hernia surgery and c-section.    PCP is Dr. Loleta Chance  Past Medical History  Diagnosis Date  . Hypertension     Past Surgical History  Procedure Laterality Date  . Hernia repair    . Cesarean section      No family history on file.  History  Substance Use Topics  . Smoking status: Current Every Day Smoker    Types: Cigarettes  . Smokeless tobacco: Not on file  . Alcohol Use: No    OB History   Grav Para Term Preterm Abortions TAB SAB Ect Mult Living                  Review of Systems 10 Systems reviewed and all are negative for acute change except as noted in the HPI.    Allergies  Acetaminophen  Home Medications   Current Outpatient Rx  Name  Route  Sig  Dispense  Refill  . lisinopril (PRINIVIL,ZESTRIL) 20 MG tablet   Oral   Take 20 mg by mouth daily.           . ondansetron (ZOFRAN) 4 MG tablet   Oral   Take 1 tablet (4 mg total) by mouth every 6 (six) hours.   12 tablet   0     BP 166/90  Pulse 64  Temp(Src) 97.9 F (36.6 C) (Oral)  Resp 21  SpO2  100%  LMP 11/23/2012  Physical Exam  Nursing note and vitals reviewed. Constitutional: She is oriented to person, place, and time. She appears well-developed and well-nourished. No distress.  HENT:  Head: Normocephalic and atraumatic.  slightly dry mucous membranes  Eyes: EOM are normal. Pupils are equal, round, and reactive to light.  Neck: Normal range of motion. Neck supple. No tracheal deviation present.  Cardiovascular: Normal rate, regular rhythm and normal heart sounds.   Pulmonary/Chest: Effort normal. No respiratory distress.  Abdominal: Soft. She exhibits no distension. There is tenderness (mild diffuse). There is no rebound and no guarding.  Musculoskeletal: Normal range of motion.  Neurological: She is alert and oriented to person, place, and time.  Skin: Skin is warm and dry.  Psychiatric: She has a normal mood and affect. Her behavior is normal.    ED Course  Procedures (including critical care time) DIAGNOSTIC STUDIES: Oxygen Saturation is 100% on room air, normal by my interpretation.    COORDINATION OF CARE: 11:57 AM Discussed ED treatment with pt and pt agrees.  12:11 PM Ordered:  Medications  sodium chloride 0.9 % bolus 1,000 mL (1,000 mLs Intravenous New Bag/Given 11/25/12 1218)  ondansetron (ZOFRAN) injection 4 mg (4 mg Intravenous Given 11/25/12 1218)       Labs Reviewed  CBC WITH DIFFERENTIAL - Abnormal; Notable for the following:    RBC 5.32 (*)    MCV 76.7 (*)    MCH 25.0 (*)    Neutrophils Relative 84 (*)    Lymphocytes Relative 9 (*)    All other components within normal limits  COMPREHENSIVE METABOLIC PANEL - Abnormal; Notable for the following:    GFR calc non Af Amer 77 (*)    GFR calc Af Amer 90 (*)    All other components within normal limits  URINALYSIS, ROUTINE W REFLEX MICROSCOPIC - Abnormal; Notable for the following:    Specific Gravity, Urine >1.030 (*)    Hgb urine dipstick TRACE (*)    All other components within normal limits   URINE MICROSCOPIC-ADD ON - Abnormal; Notable for the following:    Squamous Epithelial / LPF FEW (*)    Bacteria, UA FEW (*)    All other components within normal limits  URINE CULTURE  LIPASE, BLOOD  PREGNANCY, URINE   Dg Abd Acute W/chest  11/25/2012  *RADIOLOGY REPORT*  Clinical Data: Abdominal pain, nausea, vomiting and diarrhea.  ACUTE ABDOMEN SERIES (ABDOMEN 2 VIEW & CHEST 1 VIEW)  Comparison: Chest x-ray dated 01/29/2012  Findings: Chest shows clear lungs.  Abdominal films show no evidence of bowel obstruction.  There are some scattered air-fluid levels felt to be predominately in the colon which may be consistent with enteritis.  There is no evidence of significant ileus.  No free air or abnormal calcifications are identified. Visualized bony structures are unremarkable.  IMPRESSION: No acute bowel obstruction.  Scattered air-fluid levels in the colon may be consistent with enteritis.   Original Report Authenticated By: Irish Lack, M.D.      1. Gastroenteritis       MDM  Nausea, vomiting and diarrhea since a.m. Family members with similar symptoms. No fever. No urinary or vaginal symptoms. No chest pain or shortness of breath.  Abdomen soft nontender. No vomiting in ED. Tolerating PO.  Suspect viral gastroenteritis. Antiemetics and hydration at home. Return precautions discussed.   I personally performed the services described in this documentation, which was scribed in my presence. The recorded information has been reviewed and is accurate.    Glynn Octave, MD 11/25/12 1728

## 2012-11-25 NOTE — ED Notes (Signed)
pt states she can not void at this time, and she will notify staff when she can

## 2012-11-25 NOTE — ED Notes (Signed)
Ice water given to pt °

## 2012-11-25 NOTE — ED Notes (Signed)
Pt reports started having vomiting and diarrhea at work around 4am this morning.  C/O generalized body aches.

## 2012-11-25 NOTE — ED Notes (Signed)
Patient with no complaints at this time. Respirations even and unlabored. Skin warm/dry. Discharge instructions reviewed with patient at this time. Patient given opportunity to voice concerns/ask questions. IV removed per policy and band-aid applied to site. Patient discharged at this time and left Emergency Department with steady gait.  

## 2012-11-25 NOTE — ED Notes (Signed)
Pt is unable to void at this time.  

## 2012-11-26 LAB — URINE CULTURE

## 2013-03-13 ENCOUNTER — Ambulatory Visit: Payer: Self-pay | Admitting: Obstetrics and Gynecology

## 2013-03-27 ENCOUNTER — Ambulatory Visit: Payer: Self-pay | Admitting: Obstetrics and Gynecology

## 2013-04-01 ENCOUNTER — Encounter: Payer: Self-pay | Admitting: Obstetrics and Gynecology

## 2013-04-01 ENCOUNTER — Ambulatory Visit: Payer: Managed Care, Other (non HMO) | Admitting: Obstetrics and Gynecology

## 2013-04-01 ENCOUNTER — Ambulatory Visit (INDEPENDENT_AMBULATORY_CARE_PROVIDER_SITE_OTHER): Payer: Managed Care, Other (non HMO) | Admitting: Obstetrics and Gynecology

## 2013-04-01 VITALS — BP 138/90 | Ht 64.0 in | Wt 162.0 lb

## 2013-04-01 DIAGNOSIS — Z3202 Encounter for pregnancy test, result negative: Secondary | ICD-10-CM

## 2013-04-01 DIAGNOSIS — Z3200 Encounter for pregnancy test, result unknown: Secondary | ICD-10-CM

## 2013-04-01 LAB — POCT URINE PREGNANCY: Preg Test, Ur: NEGATIVE

## 2013-04-01 NOTE — Progress Notes (Signed)
Patient ID: Katherine Ward, female   DOB: Dec 22, 1975, 37 y.o.   MRN: [id number] Pt here today for visit with Dr. Emelda Fear. Pt was unable to stay so she stated the pregnancy test would good enough. UPT was negative. Pt stated she would call back if anything changed.

## 2013-04-01 NOTE — Progress Notes (Signed)
Patient ID: Katherine Ward, female   DOB: 1975-11-07, 37 y.o.   MRN: [id number] Pt states she bled for almost a month in May and had no period in June. UPT is negative. Pt wants to discuss what is going on.

## 2013-04-02 NOTE — Progress Notes (Signed)
Pt left without being seen.

## 2013-06-28 ENCOUNTER — Encounter (HOSPITAL_COMMUNITY): Payer: Self-pay

## 2013-06-28 ENCOUNTER — Emergency Department (HOSPITAL_COMMUNITY)
Admission: EM | Admit: 2013-06-28 | Discharge: 2013-06-28 | Disposition: A | Discharge status: Home / Self Care | Payer: Managed Care, Other (non HMO) | Attending: Emergency Medicine | Admitting: Emergency Medicine

## 2013-06-28 DIAGNOSIS — J029 Acute pharyngitis, unspecified: Secondary | ICD-10-CM | POA: Insufficient documentation

## 2013-06-28 DIAGNOSIS — Z79899 Other long term (current) drug therapy: Secondary | ICD-10-CM | POA: Insufficient documentation

## 2013-06-28 DIAGNOSIS — J40 Bronchitis, not specified as acute or chronic: Secondary | ICD-10-CM

## 2013-06-28 DIAGNOSIS — I10 Essential (primary) hypertension: Secondary | ICD-10-CM | POA: Insufficient documentation

## 2013-06-28 DIAGNOSIS — F17200 Nicotine dependence, unspecified, uncomplicated: Secondary | ICD-10-CM | POA: Insufficient documentation

## 2013-06-28 MED ORDER — IBUPROFEN 800 MG PO TABS
800.0000 mg | ORAL_TABLET | Freq: Once | ORAL | Status: AC
  Administered 2013-06-28: 800 mg via ORAL
  Filled 2013-06-28: qty 1

## 2013-06-28 MED ORDER — GUAIFENESIN-CODEINE 100-10 MG/5ML PO SYRP: 5.0000 mL | ORAL_SOLUTION | Freq: Three times a day (TID) | ORAL | Status: DC | PRN

## 2013-06-28 MED ORDER — GUAIFENESIN 100 MG/5ML PO SOLN
5.0000 mL | Freq: Once | ORAL | Status: AC
  Administered 2013-06-28: 100 mg via ORAL
  Filled 2013-06-28: qty 5

## 2013-06-28 MED ORDER — IBUPROFEN 600 MG PO TABS: 600.0000 mg | ORAL_TABLET | Freq: Four times a day (QID) | ORAL | Status: DC | PRN

## 2013-06-28 MED ORDER — ALBUTEROL SULFATE HFA 108 (90 BASE) MCG/ACT IN AERS
2.0000 | INHALATION_SPRAY | RESPIRATORY_TRACT | Status: DC | PRN
  Administered 2013-06-28: 2 via RESPIRATORY_TRACT
  Filled 2013-06-28: qty 6.7

## 2013-06-28 MED ORDER — AZITHROMYCIN 250 MG PO TABS: ORAL_TABLET | ORAL | Status: DC

## 2013-06-28 NOTE — ED Provider Notes (Signed)
Medical screening examination/treatment/procedure(s) were performed by non-physician practitioner and as supervising physician I was immediately available for consultation/collaboration. Devoria Albe, MD, Armando Gang   Ward Givens, MD 06/28/13 [phone]

## 2013-06-28 NOTE — ED Provider Notes (Signed)
CSN: [id number]     Arrival date & time 06/28/13  1257 History   First MD Initiated Contact with Patient 06/28/13 1331     Chief Complaint  Patient presents with  . Generalized Body Aches   (Consider location/radiation/quality/duration/timing/severity/associated sxs/prior Treatment) Patient is a 37 y.o. female presenting with cough. The history is provided by the patient.  Cough Cough characteristics:  Productive Sputum characteristics:  Green Severity:  Moderate Onset quality:  Gradual Duration:  1 day Progression:  Worsening Chronicity:  New Smoker: yes   Relieved by:  None tried Worsened by:  Smoking and lying down Ineffective treatments:  None tried Associated symptoms: chills, rhinorrhea, sinus congestion and sore throat   Associated symptoms: no chest pain, no ear pain, no fever and no rash  Headaches: sinus pressure.    Katherine Ward is a 37 y.o. female who presents to the ED with cough, cold, congestion and sore throat that started yesterday. She has had a low grade fever. She does smoke every day. She has taken nothing for aching or cough.   Past Medical History  Diagnosis Date  . Hypertension    Past Surgical History  Procedure Laterality Date  . Hernia repair    . Cesarean section     No family history on file. History  Substance Use Topics  . Smoking status: Current Every Day Smoker    Types: Cigarettes  . Smokeless tobacco: Never Used  . Alcohol Use: No   OB History   Grav Para Term Preterm Abortions TAB SAB Ect Mult Living                 Review of Systems  Constitutional: Positive for chills. Negative for fever.  HENT: Positive for sore throat and rhinorrhea. Negative for ear pain.   Eyes: Negative for visual disturbance.  Respiratory: Positive for cough.   Cardiovascular: Negative for chest pain.  Gastrointestinal: Negative for nausea, vomiting and abdominal pain.  Musculoskeletal: Negative for gait problem.  Skin: Negative for rash.   Allergic/Immunologic: Negative for immunocompromised state.  Neurological: Negative for dizziness. Headaches: sinus pressure.  Psychiatric/Behavioral: The patient is not nervous/anxious.     Allergies  Acetaminophen  Home Medications   Current Outpatient Rx  Name  Route  Sig  Dispense  Refill  . lisinopril (PRINIVIL,ZESTRIL) 20 MG tablet   Oral   Take 20 mg by mouth daily.            BP 142/95  Pulse 100  Temp(Src) 99 F (37.2 C) (Oral)  Resp 18  Ht 5\' 4"  (1.626 m)  Wt 169 lb (76.658 kg)  BMI 28.99 kg/m2  SpO2 100%  LMP 06/24/2013 Physical Exam  Nursing note and vitals reviewed. Constitutional: She is oriented to person, place, and time. She appears well-developed and well-nourished. No distress.  HENT:  Head: Normocephalic and atraumatic.  Right Ear: Tympanic membrane normal.  Left Ear: Tympanic membrane normal.  Nose: Rhinorrhea present.  Mouth/Throat: Uvula is midline and mucous membranes are normal. Posterior oropharyngeal erythema present.  Eyes: Conjunctivae and EOM are normal.  Neck: Neck supple.  Cardiovascular: Normal rate, regular rhythm and normal heart sounds.   Pulmonary/Chest: Effort normal. No respiratory distress. She has wheezes.  Musculoskeletal: Normal range of motion.  Lymphadenopathy:    She has no cervical adenopathy.  Neurological: She is alert and oriented to person, place, and time. No cranial nerve deficit.  Skin: Skin is warm and dry.  Psychiatric: She has a normal mood and affect.  Her behavior is normal.    ED Course  Procedures recheck BP BP 146/84  Pulse 100  Temp(Src) 99 F (37.2 C) (Oral)  Resp 18  Ht 5\' 4"  (1.626 m)  Wt 169 lb (76.658 kg)  BMI 28.99 kg/m2  SpO2 100%  LMP 06/24/2013   MDM  37 y.o. female with bronchitis and pharyngitis. Will treat with Z-Pak, albuterol inhaler and ibuprofen. Encouraged the patient to stop smoking. Patient voices understanding.  I have reviewed this patient's vital signs, nurses notes  and discussed plan of care with the patient.    Medication List    TAKE these medications       azithromycin 250 MG tablet  Commonly known as:  ZITHROMAX Z-PAK  Take two tablets today PO and then one tablet daily for infection     guaiFENesin-codeine 100-10 MG/5ML syrup  Commonly known as:  ROBITUSSIN AC  Take 5 mLs by mouth 3 (three) times daily as needed for cough.     ibuprofen 600 MG tablet  Commonly known as:  ADVIL,MOTRIN  Take 1 tablet (600 mg total) by mouth every 6 (six) hours as needed for pain.      ASK your doctor about these medications       lisinopril 20 MG tablet  Commonly known as:  PRINIVIL,ZESTRIL  Take 20 mg by mouth daily.         [facility] Orlene Och, [place] 06/28/13 1442

## 2013-06-28 NOTE — ED Notes (Signed)
Pt familiar with use of inhaler.

## 2013-06-28 NOTE — ED Notes (Signed)
Complain of generalized bidy aches. Denies other symptoms

## 2013-07-08 ENCOUNTER — Emergency Department (HOSPITAL_COMMUNITY)
Admission: EM | Admit: 2013-07-08 | Discharge: 2013-07-08 | Disposition: A | Discharge status: Home / Self Care | Payer: Managed Care, Other (non HMO) | Attending: Emergency Medicine | Admitting: Emergency Medicine

## 2013-07-08 ENCOUNTER — Encounter (HOSPITAL_COMMUNITY): Payer: Self-pay | Admitting: Emergency Medicine

## 2013-07-08 ENCOUNTER — Emergency Department (HOSPITAL_COMMUNITY): Payer: Managed Care, Other (non HMO)

## 2013-07-08 DIAGNOSIS — I10 Essential (primary) hypertension: Secondary | ICD-10-CM | POA: Insufficient documentation

## 2013-07-08 DIAGNOSIS — Z79899 Other long term (current) drug therapy: Secondary | ICD-10-CM | POA: Insufficient documentation

## 2013-07-08 DIAGNOSIS — Z3202 Encounter for pregnancy test, result negative: Secondary | ICD-10-CM | POA: Insufficient documentation

## 2013-07-08 DIAGNOSIS — R05 Cough: Secondary | ICD-10-CM

## 2013-07-08 DIAGNOSIS — Z792 Long term (current) use of antibiotics: Secondary | ICD-10-CM | POA: Insufficient documentation

## 2013-07-08 DIAGNOSIS — F17200 Nicotine dependence, unspecified, uncomplicated: Secondary | ICD-10-CM | POA: Insufficient documentation

## 2013-07-08 DIAGNOSIS — R059 Cough, unspecified: Secondary | ICD-10-CM | POA: Insufficient documentation

## 2013-07-08 LAB — COMPREHENSIVE METABOLIC PANEL
ALT: 10 U/L (ref 0–35)
AST: 16 U/L (ref 0–37)
Albumin: 3.8 g/dL (ref 3.5–5.2)
Alkaline Phosphatase: 55 U/L (ref 39–117)
BUN: 6 mg/dL (ref 6–23)
CO2: 27 mEq/L (ref 19–32)
Calcium: 9.6 mg/dL (ref 8.4–10.5)
Chloride: 103 mEq/L (ref 96–112)
Creatinine, Ser: 1.04 mg/dL (ref 0.50–1.10)
GFR calc Af Amer: 80 mL/min — ABNORMAL LOW (ref >90)
GFR calc non Af Amer: 69 mL/min — ABNORMAL LOW (ref >90)
Glucose, Bld: 94 mg/dL (ref 70–99)
Potassium: 4 mEq/L (ref 3.5–5.1)
Sodium: 139 mEq/L (ref 135–145)
Total Bilirubin: 0.3 mg/dL (ref 0.3–1.2)
Total Protein: 7.2 g/dL (ref 6.0–8.3)

## 2013-07-08 LAB — CBC WITH DIFFERENTIAL/PLATELET
Basophils Absolute: 0 10*3/uL (ref 0.0–0.1)
Basophils Relative: 0 % (ref 0–1)
MCHC: 32.8 g/dL (ref 30.0–36.0)
Neutro Abs: 3.8 10*3/uL (ref 1.7–7.7)
Neutrophils Relative %: 58 % (ref 43–77)
RDW: 13.9 % (ref 11.5–15.5)

## 2013-07-08 LAB — URINE MICROSCOPIC-ADD ON

## 2013-07-08 LAB — URINALYSIS, ROUTINE W REFLEX MICROSCOPIC
Leukocytes, UA: NEGATIVE
Nitrite: NEGATIVE
Protein, ur: NEGATIVE mg/dL
pH: 6 (ref 5.0–8.0)

## 2013-07-08 LAB — PREGNANCY, URINE: Preg Test, Ur: NEGATIVE

## 2013-07-08 MED ORDER — PHENYLEPHRINE-DM-GG 10-20-400 MG PO TABS: ORAL_TABLET | ORAL | Status: DC

## 2013-07-08 MED ORDER — KETOROLAC TROMETHAMINE 30 MG/ML IJ SOLN
30.0000 mg | Freq: Once | INTRAMUSCULAR | Status: AC
  Administered 2013-07-08: 30 mg via INTRAVENOUS

## 2013-07-08 MED ORDER — SODIUM CHLORIDE 0.9 % IV SOLN
1000.0000 mL | Freq: Once | INTRAVENOUS | Status: AC
  Administered 2013-07-08: 1000 mL via INTRAVENOUS

## 2013-07-08 MED ORDER — KETOROLAC TROMETHAMINE 30 MG/ML IJ SOLN
INTRAMUSCULAR | Status: AC
  Filled 2013-07-08: qty 1

## 2013-07-08 NOTE — ED Provider Notes (Signed)
CSN: [id number]     Arrival date & time 07/08/13  0020 History   First MD Initiated Contact with Patient 07/08/13 0242     Chief Complaint  Patient presents with  . Generalized Body Aches   (Consider location/radiation/quality/duration/timing/severity/associated sxs/prior Treatment) HPI Patient presents with generalized discomfort.  Symptoms began approximately 2 days prior to arrival.  There is no associated cough, myalgia, anorexia, nausea. No vomiting, no diarrhea, no fever. No relief from anything.  Past Medical History  Diagnosis Date  . Hypertension    Past Surgical History  Procedure Laterality Date  . Hernia repair    . Cesarean section     No family history on file. History  Substance Use Topics  . Smoking status: Current Every Day Smoker    Types: Cigarettes  . Smokeless tobacco: Never Used  . Alcohol Use: No   OB History   Grav Para Term Preterm Abortions TAB SAB Ect Mult Living                 Review of Systems  Constitutional:       Per HPI, otherwise negative  HENT:       Per HPI, otherwise negative  Respiratory:       Per HPI, otherwise negative  Cardiovascular:       Per HPI, otherwise negative  Gastrointestinal: Negative for vomiting.  Endocrine:       Negative aside from HPI  Genitourinary:       Neg aside from HPI   Musculoskeletal:       Per HPI, otherwise negative  Skin: Negative.   Neurological: Negative for syncope.    Allergies  Acetaminophen  Home Medications   Current Outpatient Rx  Name  Route  Sig  Dispense  Refill  . azithromycin (ZITHROMAX Z-PAK) 250 MG tablet      Take two tablets today PO and then one tablet daily for infection   6 each   0   . guaiFENesin-codeine (ROBITUSSIN AC) 100-10 MG/5ML syrup   Oral   Take 5 mLs by mouth 3 (three) times daily as needed for cough.   120 mL   0   . ibuprofen (ADVIL,MOTRIN) 600 MG tablet   Oral   Take 1 tablet (600 mg total) by mouth every 6 (six) hours as needed for  pain.   30 tablet   0   . lisinopril (PRINIVIL,ZESTRIL) 20 MG tablet   Oral   Take 20 mg by mouth daily.            BP 176/110  Pulse 68  Temp(Src) 98.3 F (36.8 C) (Oral)  Resp 20  Wt 170 lb (77.111 kg)  BMI 29.17 kg/m2  SpO2 100%  LMP 06/24/2013 Physical Exam  Nursing note and vitals reviewed. Constitutional: She is oriented to person, place, and time. She appears well-developed and well-nourished. No distress.  HENT:  Head: Normocephalic and atraumatic.  Eyes: Conjunctivae and EOM are normal.  Cardiovascular: Normal rate and regular rhythm.   Pulmonary/Chest: Effort normal and breath sounds normal. No stridor. No respiratory distress.  Abdominal: She exhibits no distension.  Musculoskeletal: She exhibits no edema.  Neurological: She is alert and oriented to person, place, and time. No cranial nerve deficit.  Skin: Skin is warm and dry.  Psychiatric: She has a normal mood and affect.    ED Course  Procedures (including critical care time) Labs Review Labs Reviewed  URINALYSIS, ROUTINE W REFLEX MICROSCOPIC - Abnormal; Notable for the following:  Specific Gravity, Urine >1.030 (*)    Hgb urine dipstick TRACE (*)    Ketones, ur TRACE (*)    All other components within normal limits  CBC WITH DIFFERENTIAL - Abnormal; Notable for the following:    MCV 76.1 (*)    MCH 24.9 (*)    Monocytes Relative 14 (*)    All other components within normal limits  COMPREHENSIVE METABOLIC PANEL - Abnormal; Notable for the following:    GFR calc non Af Amer 69 (*)    GFR calc Af Amer 80 (*)    All other components within normal limits  URINE MICROSCOPIC-ADD ON - Abnormal; Notable for the following:    Squamous Epithelial / LPF FEW (*)    Bacteria, UA FEW (*)    All other components within normal limits  URINE CULTURE  PREGNANCY, URINE   Imaging Review No results found. Update: Patient sleeping on repeat exam. MDM  No diagnosis found. This patient presented with multiple  complaints.  On exam the patient is awake alert, afebrile.  However, she looks uncomfortable.  Patient's presentation is most consistent with a viral illness, given the absence of notable findings, the labs consistent with dehydration.  Patient improved with IV fluids here, and on repeat exam was sleeping.  She was discharged in stable condition to follow up with her primary care physician.    Gerhard Munch, MD 07/08/13 [phone]

## 2013-07-08 NOTE — ED Notes (Signed)
Pt informed of xray to be done.

## 2013-07-08 NOTE — ED Notes (Signed)
Pt reports nasal congestion & productive cough x 2 days.

## 2013-07-08 NOTE — ED Notes (Signed)
Pt alert & oriented x4, stable gait. Patient given discharge instructions, paperwork & prescription(s). Patient  instructed to stop at the registration desk to finish any additional paperwork. Patient verbalized understanding. Pt left department w/ no further questions. 

## 2013-07-09 LAB — URINE CULTURE

## 2013-10-16 ENCOUNTER — Emergency Department (HOSPITAL_COMMUNITY)
Admission: EM | Admit: 2013-10-16 | Discharge: 2013-10-16 | Disposition: A | Discharge status: Home / Self Care | Payer: Managed Care, Other (non HMO) | Attending: Emergency Medicine | Admitting: Emergency Medicine

## 2013-10-16 ENCOUNTER — Encounter (HOSPITAL_COMMUNITY): Payer: Self-pay | Admitting: Emergency Medicine

## 2013-10-16 DIAGNOSIS — F17200 Nicotine dependence, unspecified, uncomplicated: Secondary | ICD-10-CM | POA: Insufficient documentation

## 2013-10-16 DIAGNOSIS — R519 Headache, unspecified: Secondary | ICD-10-CM

## 2013-10-16 DIAGNOSIS — I10 Essential (primary) hypertension: Secondary | ICD-10-CM | POA: Insufficient documentation

## 2013-10-16 DIAGNOSIS — R51 Headache: Secondary | ICD-10-CM | POA: Insufficient documentation

## 2013-10-16 DIAGNOSIS — R1110 Vomiting, unspecified: Secondary | ICD-10-CM

## 2013-10-16 DIAGNOSIS — K089 Disorder of teeth and supporting structures, unspecified: Secondary | ICD-10-CM | POA: Insufficient documentation

## 2013-10-16 DIAGNOSIS — R112 Nausea with vomiting, unspecified: Secondary | ICD-10-CM | POA: Insufficient documentation

## 2013-10-16 DIAGNOSIS — Z791 Long term (current) use of non-steroidal anti-inflammatories (NSAID): Secondary | ICD-10-CM | POA: Insufficient documentation

## 2013-10-16 DIAGNOSIS — Z79899 Other long term (current) drug therapy: Secondary | ICD-10-CM | POA: Insufficient documentation

## 2013-10-16 DIAGNOSIS — K0889 Other specified disorders of teeth and supporting structures: Secondary | ICD-10-CM

## 2013-10-16 DIAGNOSIS — R1033 Periumbilical pain: Secondary | ICD-10-CM | POA: Insufficient documentation

## 2013-10-16 LAB — URINALYSIS, ROUTINE W REFLEX MICROSCOPIC
Bilirubin Urine: NEGATIVE
Glucose, UA: NEGATIVE mg/dL
Ketones, ur: 15 mg/dL — AB
Nitrite: NEGATIVE
Protein, ur: NEGATIVE mg/dL
Specific Gravity, Urine: >1.03 — ABNORMAL HIGH (ref 1.005–1.030)
UROBILINOGEN UA: 1 mg/dL (ref 0.0–1.0)
pH: 5.5 (ref 5.0–8.0)

## 2013-10-16 LAB — COMPREHENSIVE METABOLIC PANEL
ALBUMIN: 4.4 g/dL (ref 3.5–5.2)
ALK PHOS: 62 U/L (ref 39–117)
ALT: 10 U/L (ref 0–35)
AST: 19 U/L (ref 0–37)
BILIRUBIN TOTAL: 0.6 mg/dL (ref 0.3–1.2)
BUN: 6 mg/dL (ref 6–23)
CHLORIDE: 105 meq/L (ref 96–112)
CO2: 25 meq/L (ref 19–32)
Calcium: 10 mg/dL (ref 8.4–10.5)
Creatinine, Ser: 0.85 mg/dL (ref 0.50–1.10)
GFR calc Af Amer: >90 mL/min (ref >90)
GFR calc non Af Amer: 88 mL/min — ABNORMAL LOW (ref >90)
Glucose, Bld: 114 mg/dL — ABNORMAL HIGH (ref 70–99)
POTASSIUM: 4.1 meq/L (ref 3.7–5.3)
Sodium: 143 mEq/L (ref 137–147)
Total Protein: 8.4 g/dL — ABNORMAL HIGH (ref 6.0–8.3)

## 2013-10-16 LAB — URINE MICROSCOPIC-ADD ON

## 2013-10-16 LAB — CBC WITH DIFFERENTIAL/PLATELET
BASOS ABS: 0 10*3/uL (ref 0.0–0.1)
BASOS PCT: 0 % (ref 0–1)
Eosinophils Absolute: 0.1 10*3/uL (ref 0.0–0.7)
Eosinophils Relative: 1 % (ref 0–5)
HCT: 40.3 % (ref 36.0–46.0)
HEMOGLOBIN: 13.5 g/dL (ref 12.0–15.0)
LYMPHS PCT: 17 % (ref 12–46)
Lymphs Abs: 1.8 10*3/uL (ref 0.7–4.0)
MCH: 25.5 pg — ABNORMAL LOW (ref 26.0–34.0)
MCHC: 33.5 g/dL (ref 30.0–36.0)
MCV: 76.2 fL — ABNORMAL LOW (ref 78.0–100.0)
Monocytes Absolute: 1 10*3/uL (ref 0.1–1.0)
Monocytes Relative: 9 % (ref 3–12)
NEUTROS ABS: 8 10*3/uL — AB (ref 1.7–7.7)
NEUTROS PCT: 74 % (ref 43–77)
Platelets: 214 10*3/uL (ref 150–400)
RBC: 5.29 MIL/uL — ABNORMAL HIGH (ref 3.87–5.11)
RDW: 13.8 % (ref 11.5–15.5)
WBC: 10.9 10*3/uL — AB (ref 4.0–10.5)

## 2013-10-16 LAB — POCT PREGNANCY, URINE: Preg Test, Ur: NEGATIVE

## 2013-10-16 LAB — LIPASE, BLOOD: Lipase: 13 U/L (ref 11–59)

## 2013-10-16 MED ORDER — IBUPROFEN 600 MG PO TABS: 600.0000 mg | ORAL_TABLET | Freq: Four times a day (QID) | ORAL | Status: DC | PRN

## 2013-10-16 MED ORDER — PENICILLIN V POTASSIUM 250 MG PO TABS: 500.0000 mg | ORAL_TABLET | Freq: Four times a day (QID) | ORAL | Status: AC

## 2013-10-16 MED ORDER — OXYCODONE-ACETAMINOPHEN 5-325 MG PO TABS: 1.0000 | ORAL_TABLET | Freq: Four times a day (QID) | ORAL | Status: DC | PRN

## 2013-10-16 MED ORDER — SODIUM CHLORIDE 0.9 % IV BOLUS (SEPSIS)
1000.0000 mL | Freq: Once | INTRAVENOUS | Status: AC
  Administered 2013-10-16: 1000 mL via INTRAVENOUS

## 2013-10-16 MED ORDER — MORPHINE SULFATE 4 MG/ML IJ SOLN
4.0000 mg | Freq: Once | INTRAMUSCULAR | Status: AC
  Administered 2013-10-16: 4 mg via INTRAVENOUS
  Filled 2013-10-16: qty 1

## 2013-10-16 MED ORDER — ONDANSETRON HCL 4 MG/2ML IJ SOLN
4.0000 mg | Freq: Once | INTRAMUSCULAR | Status: AC
  Administered 2013-10-16: 4 mg via INTRAMUSCULAR
  Filled 2013-10-16: qty 2

## 2013-10-16 NOTE — ED Notes (Signed)
Pt stated she became dizzy while walking down hallway to the exam room.

## 2013-10-16 NOTE — ED Notes (Signed)
Negative pregnancy test, results have not crossed over

## 2013-10-16 NOTE — ED Notes (Signed)
Pt's lmp dec.  Not on birth control

## 2013-10-16 NOTE — ED Provider Notes (Signed)
CSN: [id number]     Arrival date & time 10/16/13  1040 History  This chart was scribed for Katherine Baton, MD by Dorothey Baseman, ED Scribe. This patient was seen in room [facility]05[facility] and the patient's care was started at 11:03 AM.    Chief Complaint  Patient presents with  . Emesis  . Headache  . Dental Pain   The history is provided by the patient. No language interpreter was used.   HPI Comments: Katherine Ward is a 38 y.o. female who presents to the Emergency Department complaining of a pressure-like headache to the right side of the forehead onset yesterday. Patient is also complaining of a sharp pain to the periumbilical region of the abdomen with associated nausea and multiple episodes of emesis onset yesterday. Patient is also complaining of a constant pain to the right, upper dentition, 10/10 currently, that presented after the emesis started. She reports taking ibuprofen, Aleve, Advil, and BC powders without relief. She denies fever, dysuria, vaginal discharge, vaginal bleeding. She denies any sick contacts. Patient reports that she is currently sexually active and does not use birth control. She states her last menstrual period was 09/12/2013 and was normal. Patient has an allergy to acetaminophen. Patient has a history of HTN.   Past Medical History  Diagnosis Date  . Hypertension    Past Surgical History  Procedure Laterality Date  . Hernia repair    . Cesarean section     No family history on file. History  Substance Use Topics  . Smoking status: Current Every Day Smoker    Types: Cigarettes  . Smokeless tobacco: Never Used  . Alcohol Use: No   OB History   Grav Para Term Preterm Abortions TAB SAB Ect Mult Living                 Review of Systems  Constitutional: Negative for fever.  HENT: Positive for dental problem.   Gastrointestinal: Positive for nausea, vomiting and abdominal pain.  Genitourinary: Negative for dysuria, vaginal discharge and vaginal pain.   Neurological: Positive for headaches.  All other systems reviewed and are negative.    Allergies  Acetaminophen  Home Medications   Current Outpatient Rx  Name  Route  Sig  Dispense  Refill  . Aspirin-Salicylamide-Caffeine (BC HEADACHE POWDER PO)   Oral   Take 1 Package by mouth daily as needed (pain).         Marland Kitchen ibuprofen (ADVIL,MOTRIN) 800 MG tablet   Oral   Take 800 mg by mouth every 8 (eight) hours as needed.         . naproxen sodium (ANAPROX) 220 MG tablet   Oral   Take 440 mg by mouth 2 (two) times daily with a meal.         . ibuprofen (ADVIL,MOTRIN) 600 MG tablet   Oral   Take 1 tablet (600 mg total) by mouth every 6 (six) hours as needed.   30 tablet   0   . lisinopril (PRINIVIL,ZESTRIL) 20 MG tablet   Oral   Take 20 mg by mouth daily.           Marland Kitchen oxyCODONE-acetaminophen (PERCOCET/ROXICET) 5-325 MG per tablet   Oral   Take 1 tablet by mouth every 6 (six) hours as needed for severe pain.   15 tablet   0   . penicillin v potassium (VEETID) 250 MG tablet   Oral   Take 2 tablets (500 mg total) by mouth 4 (four)  times daily.   80 tablet   0    Triage Vitals: BP 172/89  Pulse 70  Temp(Src) 98.7 F (37.1 C) (Oral)  Resp 22  Ht 5\' 4"  (1.626 m)  Wt 169 lb (76.658 kg)  BMI 28.99 kg/m2  SpO2 100%  LMP 09/12/2013  Physical Exam  Nursing note and vitals reviewed. Constitutional: She is oriented to person, place, and time. She appears well-developed and well-nourished. No distress.  HENT:  Head: Normocephalic and atraumatic.  Poor dentition, caries noted, no obvious abscess, no facial swelling noted  Eyes: Pupils are equal, round, and reactive to light.  Neck: Neck supple.  Cardiovascular: Normal rate, regular rhythm and normal heart sounds.   Pulmonary/Chest: Effort normal. No respiratory distress. She has no wheezes.  Abdominal: Soft. Bowel sounds are normal. There is no tenderness. There is no rebound and no guarding.  Neurological: She is  alert and oriented to person, place, and time.  Skin: Skin is warm and dry.  Psychiatric: She has a normal mood and affect.    ED Course  Procedures (including critical care time)  DIAGNOSTIC STUDIES: Oxygen Saturation is 100% on room air, normal by my interpretation.    COORDINATION OF CARE: 11:07 AM- Ordered UA and blood labs. Will order IV fluids, morphine, and Zofran to manage symptoms. Discussed treatment plan with patient at bedside and patient verbalized agreement.     Labs Review Labs Reviewed  CBC WITH DIFFERENTIAL - Abnormal; Notable for the following:    WBC 10.9 (*)    RBC 5.29 (*)    MCV 76.2 (*)    MCH 25.5 (*)    Neutro Abs 8.0 (*)    All other components within normal limits  COMPREHENSIVE METABOLIC PANEL - Abnormal; Notable for the following:    Glucose, Bld 114 (*)    Total Protein 8.4 (*)    GFR calc non Af Amer 88 (*)    All other components within normal limits  URINALYSIS, ROUTINE W REFLEX MICROSCOPIC - Abnormal; Notable for the following:    Specific Gravity, Urine >1.030 (*)    Hgb urine dipstick LARGE (*)    Ketones, ur 15 (*)    Leukocytes, UA TRACE (*)    All other components within normal limits  URINE MICROSCOPIC-ADD ON - Abnormal; Notable for the following:    Squamous Epithelial / LPF FEW (*)    Bacteria, UA FEW (*)    All other components within normal limits  LIPASE, BLOOD  POCT PREGNANCY, URINE   Imaging Review No results found.  EKG Interpretation   None      Medications  sodium chloride 0.9 % bolus 1,000 mL (0 mLs Intravenous Stopped 10/16/13 1142)  morphine 4 MG/ML injection 4 mg (4 mg Intravenous Given 10/16/13 1123)  ondansetron (ZOFRAN) injection 4 mg (4 mg Intramuscular Given 10/16/13 1123)     MDM   1. Pain, dental   2. Headache   3. Vomiting    Patient presents with multiple complaints including HA, dental pain, abdominal pain.  VS wnl and patient exam is benign.  NO obvious dental abscess but poor dentition.   Patient given pain and nausea medicine.  W/U negative.  Suspect HA is a result of dental infection.  No obvious source for abdominal pain and exam is benign.  Patient was able to PO.  Will send home with pain medication and Pen V for dental infection.  Patient given strict return precautions.  After history, exam, and medical workup  I feel the patient has been appropriately medically screened and is safe for discharge home. Pertinent diagnoses were discussed with the patient. Patient was given return precautions.  I personally performed the services described in this documentation, which was scribed in my presence. The recorded information has been reviewed and is accurate.     Katherine Baton, MD 10/18/13 [phone]

## 2013-10-16 NOTE — Discharge Instructions (Signed)
Nausea and Vomiting Nausea is a sick feeling that often comes before throwing up (vomiting). Vomiting is a reflex where stomach contents come out of your mouth. Vomiting can cause severe loss of body fluids (dehydration). Children and elderly adults can become dehydrated quickly, especially if they also have diarrhea. Nausea and vomiting are symptoms of a condition or disease. It is important to find the cause of your symptoms. CAUSES   Direct irritation of the stomach lining. This irritation can result from increased acid production (gastroesophageal reflux disease), infection, food poisoning, taking certain medicines (such as nonsteroidal anti-inflammatory drugs), alcohol use, or tobacco use.  Signals from the brain.These signals could be caused by a headache, heat exposure, an inner ear disturbance, increased pressure in the brain from injury, infection, a tumor, or a concussion, pain, emotional stimulus, or metabolic problems.  An obstruction in the gastrointestinal tract (bowel obstruction).  Illnesses such as diabetes, hepatitis, gallbladder problems, appendicitis, kidney problems, cancer, sepsis, atypical symptoms of a heart attack, or eating disorders.  Medical treatments such as chemotherapy and radiation.  Receiving medicine that makes you sleep (general anesthetic) during surgery. DIAGNOSIS Your caregiver may ask for tests to be done if the problems do not improve after a few days. Tests may also be done if symptoms are severe or if the reason for the nausea and vomiting is not clear. Tests may include:  Urine tests.  Blood tests.  Stool tests.  Cultures (to look for evidence of infection).  X-rays or other imaging studies. Test results can help your caregiver make decisions about treatment or the need for additional tests. TREATMENT You need to stay well hydrated. Drink frequently but in small amounts.You may wish to drink water, sports drinks, clear broth, or eat frozen  ice pops or gelatin dessert to help stay hydrated.When you eat, eating slowly may help prevent nausea.There are also some antinausea medicines that may help prevent nausea. HOME CARE INSTRUCTIONS   Take all medicine as directed by your caregiver.  If you do not have an appetite, do not force yourself to eat. However, you must continue to drink fluids.  If you have an appetite, eat a normal diet unless your caregiver tells you differently.  Eat a variety of complex carbohydrates (rice, wheat, potatoes, bread), lean meats, yogurt, fruits, and vegetables.  Avoid high-fat foods because they are more difficult to digest.  Drink enough water and fluids to keep your urine clear or pale yellow.  If you are dehydrated, ask your caregiver for specific rehydration instructions. Signs of dehydration may include:  Severe thirst.  Dry lips and mouth.  Dizziness.  Dark urine.  Decreasing urine frequency and amount.  Confusion.  Rapid breathing or pulse. SEEK IMMEDIATE MEDICAL CARE IF:   You have blood or brown flecks (like coffee grounds) in your vomit.  You have black or bloody stools.  You have a severe headache or stiff neck.  You are confused.  You have severe abdominal pain.  You have chest pain or trouble breathing.  You do not urinate at least once every 8 hours.  You develop cold or clammy skin.  You continue to vomit for longer than 24 to 48 hours.  You have a fever. MAKE SURE YOU:   Understand these instructions.  Will watch your condition.  Will get help right away if you are not doing well or get worse. Document Released: 09/17/2005 Document Revised: 12/10/2011 Document Reviewed: 02/14/2011 [facility] Patient Information 2014 [employer] [place]. Dental Pain A tooth ache  may be caused by cavities (tooth decay). Cavities expose the nerve of the tooth to air and hot or cold temperatures. It may come from an infection or abscess (also called a boil or furuncle)  around your tooth. It is also often caused by dental caries (tooth decay). This causes the pain you are having. DIAGNOSIS  Your caregiver can diagnose this problem by exam. TREATMENT   If caused by an infection, it may be treated with medications which kill germs (antibiotics) and pain medications as prescribed by your caregiver. Take medications as directed.  Only take over-the-counter or prescription medicines for pain, discomfort, or fever as directed by your caregiver.  Whether the tooth ache today is caused by infection or dental disease, you should see your dentist as soon as possible for further care. SEEK MEDICAL CARE IF: The exam and treatment you received today has been provided on an emergency basis only. This is not a substitute for complete medical or dental care. If your problem worsens or new problems (symptoms) appear, and you are unable to meet with your dentist, call or return to this location. SEEK IMMEDIATE MEDICAL CARE IF:   You have a fever.  You develop redness and swelling of your face, jaw, or neck.  You are unable to open your mouth.  You have severe pain uncontrolled by pain medicine. MAKE SURE YOU:   Understand these instructions.  Will watch your condition.  Will get help right away if you are not doing well or get worse. Document Released: 09/17/2005 Document Revised: 12/10/2011 Document Reviewed: 05/05/2008 [facility] Patient Information 2014 [place], [place].

## 2013-10-16 NOTE — ED Notes (Signed)
1. Pt reports vomiting and headache since yesterday. No fever or diarrhea. 2. Toothache on right upper since vomiting.

## 2013-10-16 NOTE — ED Notes (Signed)
Pt alert & oriented x4, stable gait. Patient given discharge instructions, paperwork & prescription(s). Patient  instructed to stop at the registration desk to finish any additional paperwork. Patient verbalized understanding. Pt left department w/ no further questions. 

## 2014-04-09 ENCOUNTER — Encounter (HOSPITAL_COMMUNITY): Payer: Self-pay | Admitting: Emergency Medicine

## 2014-04-09 ENCOUNTER — Emergency Department (HOSPITAL_COMMUNITY)
Admission: EM | Admit: 2014-04-09 | Discharge: 2014-04-09 | Disposition: A | Discharge status: Home / Self Care | Payer: Managed Care, Other (non HMO) | Attending: Emergency Medicine | Admitting: Emergency Medicine

## 2014-04-09 ENCOUNTER — Inpatient Hospital Stay (HOSPITAL_COMMUNITY)
Admission: AD | Admit: 2014-04-09 | Payer: Managed Care, Other (non HMO) | Source: Intra-hospital | Admitting: Psychiatry

## 2014-04-09 DIAGNOSIS — Z79899 Other long term (current) drug therapy: Secondary | ICD-10-CM | POA: Insufficient documentation

## 2014-04-09 DIAGNOSIS — F3289 Other specified depressive episodes: Secondary | ICD-10-CM | POA: Insufficient documentation

## 2014-04-09 DIAGNOSIS — A599 Trichomoniasis, unspecified: Secondary | ICD-10-CM | POA: Insufficient documentation

## 2014-04-09 DIAGNOSIS — F17200 Nicotine dependence, unspecified, uncomplicated: Secondary | ICD-10-CM | POA: Insufficient documentation

## 2014-04-09 DIAGNOSIS — I10 Essential (primary) hypertension: Secondary | ICD-10-CM | POA: Insufficient documentation

## 2014-04-09 DIAGNOSIS — Z3202 Encounter for pregnancy test, result negative: Secondary | ICD-10-CM | POA: Insufficient documentation

## 2014-04-09 DIAGNOSIS — R45851 Suicidal ideations: Secondary | ICD-10-CM

## 2014-04-09 DIAGNOSIS — F1210 Cannabis abuse, uncomplicated: Secondary | ICD-10-CM | POA: Insufficient documentation

## 2014-04-09 DIAGNOSIS — F329 Major depressive disorder, single episode, unspecified: Secondary | ICD-10-CM | POA: Insufficient documentation

## 2014-04-09 DIAGNOSIS — F32A Depression, unspecified: Secondary | ICD-10-CM

## 2014-04-09 LAB — COMPREHENSIVE METABOLIC PANEL
ALBUMIN: 3.7 g/dL (ref 3.5–5.2)
ALK PHOS: 51 U/L (ref 39–117)
ALT: 12 U/L (ref 0–35)
AST: 14 U/L (ref 0–37)
Anion gap: 11 (ref 5–15)
BUN: 4 mg/dL — AB (ref 6–23)
CO2: 25 mEq/L (ref 19–32)
CREATININE: 0.85 mg/dL (ref 0.50–1.10)
Calcium: 9.3 mg/dL (ref 8.4–10.5)
Chloride: 104 mEq/L (ref 96–112)
GFR calc Af Amer: >90 mL/min (ref >90)
GFR calc non Af Amer: 88 mL/min — ABNORMAL LOW (ref >90)
Glucose, Bld: 120 mg/dL — ABNORMAL HIGH (ref 70–99)
POTASSIUM: 3.7 meq/L (ref 3.7–5.3)
Sodium: 140 mEq/L (ref 137–147)
TOTAL PROTEIN: 7.2 g/dL (ref 6.0–8.3)
Total Bilirubin: 0.3 mg/dL (ref 0.3–1.2)

## 2014-04-09 LAB — HIV ANTIBODY (ROUTINE TESTING W REFLEX): HIV 1&2 Ab, 4th Generation: NONREACTIVE

## 2014-04-09 LAB — CBC
HCT: 38.8 % (ref 36.0–46.0)
Hemoglobin: 12.9 g/dL (ref 12.0–15.0)
MCH: 24.8 pg — ABNORMAL LOW (ref 26.0–34.0)
MCHC: 33.2 g/dL (ref 30.0–36.0)
MCV: 74.6 fL — ABNORMAL LOW (ref 78.0–100.0)
Platelets: 273 10*3/uL (ref 150–400)
RBC: 5.2 MIL/uL — ABNORMAL HIGH (ref 3.87–5.11)
RDW: 14.1 % (ref 11.5–15.5)
WBC: 8.7 10*3/uL (ref 4.0–10.5)

## 2014-04-09 LAB — RAPID URINE DRUG SCREEN, HOSP PERFORMED
Amphetamines: NOT DETECTED
BENZODIAZEPINES: NOT DETECTED
Barbiturates: NOT DETECTED
COCAINE: NOT DETECTED
Opiates: NOT DETECTED
Tetrahydrocannabinol: POSITIVE — AB

## 2014-04-09 LAB — ETHANOL: Alcohol, Ethyl (B): <11 mg/dL (ref 0–11)

## 2014-04-09 LAB — URINALYSIS, ROUTINE W REFLEX MICROSCOPIC
BILIRUBIN URINE: NEGATIVE
GLUCOSE, UA: NEGATIVE mg/dL
Ketones, ur: NEGATIVE mg/dL
NITRITE: NEGATIVE
PH: 6 (ref 5.0–8.0)
Protein, ur: NEGATIVE mg/dL
Urobilinogen, UA: 0.2 mg/dL (ref 0.0–1.0)

## 2014-04-09 LAB — URINE MICROSCOPIC-ADD ON

## 2014-04-09 LAB — RPR

## 2014-04-09 LAB — PREGNANCY, URINE: PREG TEST UR: NEGATIVE

## 2014-04-09 MED ORDER — IBUPROFEN 400 MG PO TABS: 600.0000 mg | ORAL_TABLET | Freq: Three times a day (TID) | ORAL | Status: DC | PRN

## 2014-04-09 MED ORDER — METRONIDAZOLE 500 MG PO TABS
500.0000 mg | ORAL_TABLET | Freq: Once | ORAL | Status: AC
  Administered 2014-04-09: 500 mg via ORAL
  Filled 2014-04-09: qty 1

## 2014-04-09 MED ORDER — LORAZEPAM 1 MG PO TABS
1.0000 mg | ORAL_TABLET | Freq: Three times a day (TID) | ORAL | Status: DC | PRN
  Administered 2014-04-09: 1 mg via ORAL
  Filled 2014-04-09: qty 1

## 2014-04-09 MED ORDER — ONDANSETRON HCL 4 MG PO TABS: 4.0000 mg | ORAL_TABLET | Freq: Three times a day (TID) | ORAL | Status: DC | PRN

## 2014-04-09 MED ORDER — ALUM & MAG HYDROXIDE-SIMETH 200-200-20 MG/5ML PO SUSP: 30.0000 mL | ORAL | Status: DC | PRN

## 2014-04-09 NOTE — ED Notes (Signed)
During triage, pt expressed thoughts of harming herself. Pt stated "I am going through a divorce. I have lost my car, my job. I can barely take care of my kids. I just don't know how to keep going." When asked if she has a plan, pt reports "to cut my wrists". Pt denies any attempts. Pt states "I haven't slept or ate in two days. Every time I close my eyes, I hear voices saying 'Just do it already'." Pt very tearful.

## 2014-04-09 NOTE — ED Notes (Signed)
Spoke w/[facility]. [facility].  She can go after 1430 today.  Patient informed of this.

## 2014-04-09 NOTE — ED Notes (Signed)
[facility]  She has two family members present in room who verify they will be staying with her and that patient will make appointment with a therapist.  Spoke w/Dr. Adriana Simasook who is in agreement to let patient go home.  Notified [facility] and patient of plan.

## 2014-04-09 NOTE — Discharge Instructions (Signed)
Follow-up with community mental health resources °

## 2014-04-09 NOTE — ED Notes (Signed)
Patient with no complaints at this time. Respirations even and unlabored. Skin warm/dry. Discharge instructions reviewed with patient at this time. Patient given opportunity to voice concerns/ask questions. Patient discharged at this time and left Emergency Department with steady gait.   

## 2014-04-09 NOTE — ED Provider Notes (Signed)
No suicidal or homicidal ideation. No psychosis.  Donnetta HutchingBrian Duyen Beckom, MD 04/09/14 1355

## 2014-04-09 NOTE — ED Provider Notes (Signed)
CSN: [id number]     Arrival date & time 04/09/14  0420 History   First MD Initiated Contact with Patient 04/09/14 [phone]     Chief Complaint  Patient presents with  . Abdominal Pain     (Consider location/radiation/quality/duration/timing/severity/associated sxs/prior Treatment) HPI Comments: The pt is a tearful 38 y/o female with 2 complaints.  #1 - Abdominal pain - she states that she has had abd pain in her lower mid abd X 2 days - constant, nothing makes better or worse including no change with urinating (no dysuria), defecation and no associated vaginal bleeding or d/c.  In fact she states that she had her menses 5 days ago and it only lasted 1.5 days when it usually lasts 7.  She has had a BTL and has not taken a home pregnancy test.  She has not had appetite and not eaten in 2 days per the pt's report.  She denies radiation of the pain to the back.  She denies f/c/n/v/diarrhea.    #2 - Depression and SI - pt has no psychiatric history, she states that over the last week she has had increased depression that she relates to losing her apartment, her job, a significant other and has been having what she describes as voices in her head when she closes her eyes that are telling her to end it.  She has contemplated cutting her wrists.  She has no hx of any of this until recently.  She currently lives with her mother.  She does not see a therapist and has not been in counseling in the past.  She denies any attempt on her life prior to arrival.  Patient is a 38 y.o. female presenting with abdominal pain. The history is provided by the patient.  Abdominal Pain   Past Medical History  Diagnosis Date  . Hypertension    Past Surgical History  Procedure Laterality Date  . Hernia repair    . Cesarean section     No family history on file. History  Substance Use Topics  . Smoking status: Current Every Day Smoker -- 1.00 packs/day    Types: Cigarettes  . Smokeless tobacco: Never Used  .  Alcohol Use: Yes     Comment: occasionally   OB History   Grav Para Term Preterm Abortions TAB SAB Ect Mult Living                 Review of Systems  Gastrointestinal: Positive for abdominal pain.  All other systems reviewed and are negative.     Allergies  Acetaminophen  Home Medications   Prior to Admission medications   Medication Sig Start Date End Date Taking? Authorizing Provider  Aspirin-Salicylamide-Caffeine (BC HEADACHE POWDER PO) Take 1 Package by mouth daily as needed (pain).   Yes Historical Provider, MD  ibuprofen (ADVIL,MOTRIN) 800 MG tablet Take 800 mg by mouth every 8 (eight) hours as needed.   Yes Historical Provider, MD  lisinopril (PRINIVIL,ZESTRIL) 20 MG tablet Take 20 mg by mouth daily.     Yes Historical Provider, MD  ibuprofen (ADVIL,MOTRIN) 600 MG tablet Take 1 tablet (600 mg total) by mouth every 6 (six) hours as needed. 10/16/13   [name], MD  naproxen sodium (ANAPROX) 220 MG tablet Take 440 mg by mouth 2 (two) times daily with a meal.    Historical Provider, MD  oxyCODONE-acetaminophen (PERCOCET/ROXICET) 5-325 MG per tablet Take 1 tablet by mouth every 6 (six) hours as needed for severe pain. 10/16/13  Shon Batonourtney F Horton, MD   BP 172/108  Pulse 79  Temp(Src) 98.6 F (37 C) (Oral)  Resp 16  Ht 5\' 4"  (1.626 m)  Wt 153 lb (69.4 kg)  BMI 26.25 kg/m2  SpO2 100%  LMP 04/04/2014 Physical Exam  Nursing note and vitals reviewed. Constitutional: She appears well-developed and well-nourished. No distress.  HENT:  Head: Normocephalic and atraumatic.  Mouth/Throat: Oropharynx is clear and moist. No oropharyngeal exudate.  Eyes: Conjunctivae and EOM are normal. Pupils are equal, round, and reactive to light. Right eye exhibits no discharge. Left eye exhibits no discharge. No scleral icterus.  Neck: Normal range of motion. Neck supple. No JVD present. No thyromegaly present.  Cardiovascular: Normal rate, regular rhythm, normal heart sounds and intact  distal pulses.  Exam reveals no gallop and no friction rub.   No murmur heard. Pulmonary/Chest: Effort normal and breath sounds normal. No respiratory distress. She has no wheezes. She has no rales.  Abdominal: Soft. Bowel sounds are normal. She exhibits no distension and no mass. There is tenderness ( minimal ttp in the SP region - very soft, no guardign, no peritoneal signs.). There is no rebound and no guarding.  Genitourinary:  Chaperone present for exam:  Minimal creamy d/c, no foul odor, no FB's, no bleeding, os closed.  External genitalia appears normal  Musculoskeletal: Normal range of motion. She exhibits no edema and no tenderness.  Lymphadenopathy:    She has no cervical adenopathy.  Neurological: She is alert. Coordination normal.  Skin: Skin is warm and dry. No rash noted. No erythema.  Psychiatric: Her speech is not delayed, not tangential and not slurred. She is slowed and withdrawn. Thought content is not paranoid. Cognition and memory are normal. She exhibits a depressed mood. She expresses suicidal ideation. She expresses no homicidal ideation. She expresses suicidal plans. She is communicative.    ED Course  Procedures (including critical care time) Labs Review Labs Reviewed  PREGNANCY, URINE  URINALYSIS, ROUTINE W REFLEX MICROSCOPIC  URINE RAPID DRUG SCREEN (HOSP PERFORMED)  CBC  COMPREHENSIVE METABOLIC PANEL  ETHANOL    Imaging Review No results found.    MDM   Final diagnoses:  None    Ms. Bonifield appears to be decompensating from a psychiatric standpoint and will need evaluation from a psychiatrist.  This has been ordered, laboratory workup has been ordered as well including urinalysis and urine pregnancy to further evaluate the patient for possible sources of abdominal pain. She denies any infectious symptoms, she should not be pregnant given her prior tubal ligation, she has essentially no no appreciable abdominal tenderness on exam. It may be that she is  experiencing the pain as a result of her depression and suicidality. Her blood pressure is elevated, the rest of her vital signs remain normal. Will recheck  Trich present in UA, pelvic perofrmed and showed no signs of STD - labs ordered.  CBC, CMP and UA without obvious source.  Will culture urine.  Pt states that she wants to end it all - she wants to be alone - she is now trying to leave the ED against medical advice - I have sat down with patient and discussed the necessity of staying to talk to a psychiatrist - she has refused to talk to me - she is continuously tearful - for safety reasons commitment papers were filled out - can be notorized as needed if pt attempts to elope.  Upon d/c the pt will need Rx for Flagyl  Change  of shift - care signed out to oncoming physician.  Vida Roller, MD 04/09/14 [phone]

## 2014-04-09 NOTE — ED Notes (Signed)
Pt c/o lower abdominal pain x 2 days. Denies N/V/D. Denies any pain with urination or BM.

## 2014-04-09 NOTE — ED Notes (Signed)
Pt informed by Dr. Hyacinth MeekerMiller that she has tested positive for trichomonas. Pt broke down in tears after the exam stating "I just don't want to live. Let me die. I know you can do it. Just let me die." Was able to calm down pt.

## 2014-04-09 NOTE — BH Assessment (Signed)
Tele Assessment Note   Katherine Ward is an 38 y.o. female. She presents to [facility] with increased depression. She is going through a divorce, loss her job January 2015, loss her car, and loss her apartment. She currently lives with her mother long with her 4 children ( 2, 7, 1018, and 4119). Since loosing her job she reports ongoing depression. Onset of suicidal thoughts was 2 days ago. She has a plan to slit her wrist. She is unable to contract for safety. She has no hx of suicide attempts/gestures. No hx of self mutilating behaviors. She describes her depression as hopelessness, guilt, fatigue, and crying spells. Her anxiety is rated as severe. She denies HI and AVH's. She does not have an outpatient mental health provider. She has no hx of inpatient mental health treatment. Patient denies alcohol and drug use. However, UDS is + for THC.  [facility] by Renata Capriceonrad, NP to Dr. Jama Flavorsobos. The room assignment is 508-1.   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 Hypertension    Axis IV: other psychosocial or environmental problems, problems related to social environment, problems with access to health care services and problems with primary support group Axis V: 31-40 impairment in reality testing  Past Medical History:  Past Medical History  Diagnosis Date  . Hypertension     Past Surgical History  Procedure Laterality Date  . Hernia repair    . Cesarean section      Family History: No family history on file.  Social History:  reports that she has been smoking Cigarettes.  She has been smoking about 1.00 pack per day. She has never used smokeless tobacco. She reports that she drinks alcohol. She reports that she does not use illicit drugs.  Additional Social History:  Alcohol / Drug Use Pain Medications: SEE MAR Prescriptions: SEE MAR Over the Counter: SEE MAR History of  alcohol / drug use?: Yes Substance #1 Name of Substance 1: THC- pt did not acknowledge use; however, UDS + 1 - Age of First Use: n/a 1 - Amount (size/oz): n/a 1 - Frequency: n/a` 1 - Duration: n/a 1 - Last Use / Amount: unk  CIWA: CIWA-Ar BP: 172/108 mmHg Pulse Rate: 79 COWS:    Allergies:  Allergies  Allergen Reactions  . Acetaminophen     Gives patient a very high fever    Home Medications:  (Not in a hospital admission)  OB/GYN Status:  Patient's last menstrual period was 04/04/2014.  General Assessment Data Location of Assessment: [facility] Is this a Tele or Face-to-Face Assessment?: Tele Assessment Is this an Initial Assessment or a Re-assessment for this encounter?: Initial Assessment Living Arrangements: Other (Comment);Children;Parent (lives with mother along with 4 children) Can pt return to current living arrangement?: Yes Admission Status: Voluntary Is patient capable of signing voluntary admission?: Yes Transfer from: Acute Hospital Referral Source: Self/Family/Friend     [facility] Living Arrangements: Other (Comment);Children;Parent (lives with mother along with 4 children) Name of Psychiatrist:  (No psychiatrist ) Name of Therapist:  (No therapist )  Education Status Is patient currently in school?: No  Risk to self Suicidal Ideation: Yes-Currently Present Suicidal Intent: Yes-Currently Present Is patient at risk for suicide?: Yes Suicidal Plan?: Yes-Currently Present Specify Current Suicidal Plan:  (slit wrist ) Access to Means: Yes Specify Access to Suicidal Means:  (sharp objects) What has been  your use of drugs/alcohol within the last 12 months?:  (pt denies use; UDS positive for THC) Previous Attempts/Gestures: No How many times?:  (n/a) Other Self Harm Risks:  (none reported ) Triggers for Past Attempts: Other (Comment) (no past attempts/gestures ) Intentional Self Injurious Behavior: None Family Suicide History: Unknown Recent  stressful life event(s): Financial Problems;Job Loss;Conflict (Comment);Divorce;Other (Comment);Turmoil (Comment) (living w/ mom along with 4 children) Persecutory voices/beliefs?: No Depression: Yes Depression Symptoms: Feeling angry/irritable;Feeling worthless/self pity;Fatigue;Isolating;Loss of interest in usual pleasures;Guilt;Tearfulness;Insomnia;Despondent Substance abuse history and/or treatment for substance abuse?: No Suicide prevention information given to non-admitted patients: Not applicable  Risk to Others Homicidal Ideation: No Thoughts of Harm to Others: No Current Homicidal Intent: No Current Homicidal Plan: No Access to Homicidal Means: No Identified Victim:  (n/a) History of harm to others?: No Assessment of Violence: None Noted Violent Behavior Description:  (patient is calm and cooperative ) Does patient have access to weapons?: No Criminal Charges Pending?: No Does patient have a court date: No  Psychosis Hallucinations: None noted Delusions: None noted  Mental Status Report Appear/Hygiene: Disheveled Eye Contact: Good Motor Activity: Freedom of movement Speech: Logical/coherent Level of Consciousness: Alert Mood: Depressed Affect: Appropriate to circumstance Anxiety Level: None Thought Processes: Coherent;Relevant Judgement: Impaired Orientation: Person;Time;Situation;Place Obsessive Compulsive Thoughts/Behaviors: None  Cognitive Functioning Concentration: Decreased Memory: Recent Intact;Remote Intact IQ: Average Insight: Poor Impulse Control: Fair Appetite: Poor Weight Loss:  (no appetite in the past 2 days ) Weight Gain:  (none reported ) Sleep: Decreased Total Hours of Sleep:  (varies ) Vegetative Symptoms: None  ADLScreening [facility] Assessment Services) Patient's cognitive ability adequate to safely complete daily activities?: No Patient able to express need for assistance with ADLs?: Yes Independently performs ADLs?: Yes (appropriate for  developmental age)  Prior Inpatient Therapy Prior Inpatient Therapy: No Prior Therapy Dates:  (n/a) Prior Therapy Facilty/Provider(s):  (n/a) Reason for Treatment:  (n/a)  Prior Outpatient Therapy Prior Outpatient Therapy: No Prior Therapy Dates:  (n/a) Prior Therapy Facilty/Provider(s):  (n/a) Reason for Treatment:  (n/a)  ADL Screening (condition at time of admission) Patient's cognitive ability adequate to safely complete daily activities?: No Is the patient deaf or have difficulty hearing?: No Does the patient have difficulty seeing, even when wearing glasses/contacts?: No Does the patient have difficulty concentrating, remembering, or making decisions?: Yes Patient able to express need for assistance with ADLs?: Yes Does the patient have difficulty dressing or bathing?: No Independently performs ADLs?: Yes (appropriate for developmental age) Does the patient have difficulty walking or climbing stairs?: No Weakness of Legs: None Weakness of Arms/Hands: None  Home Assistive Devices/Equipment Home Assistive Devices/Equipment: None    Abuse/Neglect Assessment (Assessment to be complete while patient is alone) Physical Abuse: Denies Verbal Abuse: Denies Sexual Abuse: Denies Exploitation of patient/patient's resources: Denies Self-Neglect: Denies Values / Beliefs Cultural Requests During Hospitalization: None Spiritual Requests During Hospitalization: None   Advance Directives (For Healthcare) Advance Directive: Patient does not have advance directive Nutrition Screen- [facility] Adult/[facility] Patient's home diet: Regular  Additional Information 1:1 In Past 12 Months?: No CIRT Risk: No Elopement Risk: No Does patient have medical clearance?: Yes     Disposition:  Disposition Initial Assessment Completed for this Encounter: Yes Disposition of Patient: Inpatient treatment program (Room 508-1) Type of inpatient treatment program: Adult (Patient accepted to [facility] by Renata Caprice, NP  to Dr. Jama Flavors)  Melynda Ripple [facility] 04/09/2014 2:15 PM

## 2014-04-10 LAB — GC/CHLAMYDIA PROBE AMP
CT Probe RNA: NEGATIVE
GC Probe RNA: NEGATIVE

## 2014-04-11 LAB — URINE CULTURE
Colony Count: 25000
Special Requests: NORMAL

## 2014-10-14 ENCOUNTER — Encounter (HOSPITAL_COMMUNITY): Payer: Self-pay | Admitting: Emergency Medicine

## 2014-10-14 ENCOUNTER — Emergency Department (HOSPITAL_COMMUNITY)
Admission: EM | Admit: 2014-10-14 | Discharge: 2014-10-14 | Disposition: A | Discharge status: Home / Self Care | Payer: Managed Care, Other (non HMO) | Attending: Emergency Medicine | Admitting: Emergency Medicine

## 2014-10-14 DIAGNOSIS — R1031 Right lower quadrant pain: Secondary | ICD-10-CM | POA: Diagnosis present

## 2014-10-14 DIAGNOSIS — Z3202 Encounter for pregnancy test, result negative: Secondary | ICD-10-CM | POA: Diagnosis not present

## 2014-10-14 DIAGNOSIS — R51 Headache: Secondary | ICD-10-CM | POA: Insufficient documentation

## 2014-10-14 DIAGNOSIS — Z9889 Other specified postprocedural states: Secondary | ICD-10-CM | POA: Diagnosis not present

## 2014-10-14 DIAGNOSIS — I10 Essential (primary) hypertension: Secondary | ICD-10-CM | POA: Diagnosis not present

## 2014-10-14 DIAGNOSIS — Z720 Tobacco use: Secondary | ICD-10-CM | POA: Insufficient documentation

## 2014-10-14 DIAGNOSIS — R112 Nausea with vomiting, unspecified: Secondary | ICD-10-CM

## 2014-10-14 DIAGNOSIS — E860 Dehydration: Secondary | ICD-10-CM

## 2014-10-14 DIAGNOSIS — Z79899 Other long term (current) drug therapy: Secondary | ICD-10-CM | POA: Insufficient documentation

## 2014-10-14 DIAGNOSIS — N946 Dysmenorrhea, unspecified: Secondary | ICD-10-CM

## 2014-10-14 DIAGNOSIS — N938 Other specified abnormal uterine and vaginal bleeding: Secondary | ICD-10-CM | POA: Diagnosis not present

## 2014-10-14 LAB — URINE MICROSCOPIC-ADD ON

## 2014-10-14 LAB — CBC WITH DIFFERENTIAL/PLATELET
Basophils Absolute: 0 10*3/uL (ref 0.0–0.1)
Basophils Relative: 0 % (ref 0–1)
EOS PCT: 2 % (ref 0–5)
Eosinophils Absolute: 0.1 10*3/uL (ref 0.0–0.7)
HCT: 34.2 % — ABNORMAL LOW (ref 36.0–46.0)
HEMOGLOBIN: 11.2 g/dL — AB (ref 12.0–15.0)
LYMPHS ABS: 0.6 10*3/uL — AB (ref 0.7–4.0)
LYMPHS PCT: 19 % (ref 12–46)
MCH: 25.2 pg — ABNORMAL LOW (ref 26.0–34.0)
MCHC: 32.7 g/dL (ref 30.0–36.0)
MCV: 77 fL — ABNORMAL LOW (ref 78.0–100.0)
MONOS PCT: 13 % — AB (ref 3–12)
Monocytes Absolute: 0.4 10*3/uL (ref 0.1–1.0)
NEUTROS ABS: 2.2 10*3/uL (ref 1.7–7.7)
NEUTROS PCT: 66 % (ref 43–77)
Platelets: 141 10*3/uL — ABNORMAL LOW (ref 150–400)
RBC: 4.44 MIL/uL (ref 3.87–5.11)
RDW: 13.7 % (ref 11.5–15.5)
WBC: 3.3 10*3/uL — AB (ref 4.0–10.5)

## 2014-10-14 LAB — COMPREHENSIVE METABOLIC PANEL
ALT: 12 U/L (ref 0–35)
AST: 15 U/L (ref 0–37)
Albumin: 3.4 g/dL — ABNORMAL LOW (ref 3.5–5.2)
Alkaline Phosphatase: 41 U/L (ref 39–117)
Anion gap: 3 — ABNORMAL LOW (ref 5–15)
BILIRUBIN TOTAL: 0.9 mg/dL (ref 0.3–1.2)
BUN: 10 mg/dL (ref 6–23)
CALCIUM: 8.2 mg/dL — AB (ref 8.4–10.5)
CO2: 26 mmol/L (ref 19–32)
Chloride: 110 mEq/L (ref 96–112)
Creatinine, Ser: 0.88 mg/dL (ref 0.50–1.10)
GFR calc non Af Amer: 84 mL/min — ABNORMAL LOW (ref >90)
Glucose, Bld: 87 mg/dL (ref 70–99)
Potassium: 4 mmol/L (ref 3.5–5.1)
Sodium: 139 mmol/L (ref 135–145)
TOTAL PROTEIN: 6 g/dL (ref 6.0–8.3)

## 2014-10-14 LAB — URINALYSIS, ROUTINE W REFLEX MICROSCOPIC
BILIRUBIN URINE: NEGATIVE
GLUCOSE, UA: NEGATIVE mg/dL
KETONES UR: NEGATIVE mg/dL
NITRITE: NEGATIVE
Specific Gravity, Urine: >1.03 — ABNORMAL HIGH (ref 1.005–1.030)
Urobilinogen, UA: 0.2 mg/dL (ref 0.0–1.0)
pH: 5.5 (ref 5.0–8.0)

## 2014-10-14 LAB — WET PREP, GENITAL
Trich, Wet Prep: NONE SEEN
Yeast Wet Prep HPF POC: NONE SEEN

## 2014-10-14 LAB — PREGNANCY, URINE: PREG TEST UR: NEGATIVE

## 2014-10-14 MED ORDER — KETOROLAC TROMETHAMINE 30 MG/ML IJ SOLN
30.0000 mg | Freq: Once | INTRAMUSCULAR | Status: AC
  Administered 2014-10-14: 30 mg via INTRAVENOUS
  Filled 2014-10-14: qty 1

## 2014-10-14 MED ORDER — SODIUM CHLORIDE 0.9 % IV SOLN
INTRAVENOUS | Status: AC
  Administered 2014-10-14: 10:00:00 via INTRAVENOUS

## 2014-10-14 MED ORDER — SODIUM CHLORIDE 0.9 % IV SOLN
INTRAVENOUS | Status: AC
Start: 2014-10-14 — End: 2014-10-14
  Administered 2014-10-14: 09:00:00 via INTRAVENOUS

## 2014-10-14 MED ORDER — HYDROMORPHONE HCL 1 MG/ML IJ SOLN
1.0000 mg | Freq: Once | INTRAMUSCULAR | Status: AC
  Administered 2014-10-14: 1 mg via INTRAVENOUS
  Filled 2014-10-14: qty 1

## 2014-10-14 MED ORDER — HYDROCODONE-IBUPROFEN 5-200 MG PO TABS: 1.0000 | ORAL_TABLET | Freq: Three times a day (TID) | ORAL | Status: DC | PRN

## 2014-10-14 MED ORDER — PROMETHAZINE HCL 25 MG PO TABS: 25.0000 mg | ORAL_TABLET | Freq: Four times a day (QID) | ORAL | Status: DC | PRN

## 2014-10-14 MED ORDER — ONDANSETRON HCL 4 MG/2ML IJ SOLN
4.0000 mg | Freq: Once | INTRAMUSCULAR | Status: AC
  Administered 2014-10-14: 4 mg via INTRAVENOUS
  Filled 2014-10-14: qty 2

## 2014-10-14 NOTE — ED Notes (Signed)
Patient refusing in and out cath. "I'll just drink and try to go"

## 2014-10-14 NOTE — ED Notes (Signed)
States body aches have improved. Still unable to urinate.

## 2014-10-14 NOTE — Discharge Instructions (Signed)
Take the medication as directed. Do not take the narcotic if driving as it will make you sleepy. Follow up with Dr. Emelda FearFerguson, return here as needed for worsening symptoms.

## 2014-10-14 NOTE — ED Notes (Signed)
Pt reports lower abdominal pain and generalized body aches,n/v x2 days. Pt denies any known fever,diarrhea.

## 2014-10-14 NOTE — ED Provider Notes (Signed)
CSN: [id number]     Arrival date & time 10/14/14  6387 History   First MD Initiated Contact with Patient 10/14/14 0801     Chief Complaint  Patient presents with  . Abdominal Pain     (Consider location/radiation/quality/duration/timing/severity/associated sxs/prior Treatment) Patient is a 39 y.o. female presenting with abdominal pain. The history is provided by the patient.  Abdominal Pain Pain location:  RLQ and LLQ Pain quality: cramping and sharp   Pain radiates to:  Back, R leg and L leg Pain severity:  Severe Onset quality:  Gradual Duration:  2 days Timing:  Constant Progression:  Worsening Chronicity:  New Relieved by:  Nothing Worsened by:  Nothing tried Ineffective treatments:  OTC medications Associated symptoms: anorexia, chills, nausea, vaginal bleeding and vomiting   Associated symptoms: no chest pain, no constipation, no cough, no diarrhea, no dysuria, no fever, no shortness of breath and no sore throat    ZELENA BUSHONG is a 39 y.o. female who presents to the ED with abdominal pain, n/v that started 2 days ago. She has a hx of HTN and has not been able to keep her medication down. LMP 10/14/14. She states that she always has abdominal pain with menses but this pain and vomiting started the day before she started her period. She is not sure if it is related since she usually only has pain without n/v. She has had to see Dr. Emelda Fear in the past for dysmenorrhea.   Past Medical History  Diagnosis Date  . Hypertension    Past Surgical History  Procedure Laterality Date  . Hernia repair    . Cesarean section     History reviewed. No pertinent family history. History  Substance Use Topics  . Smoking status: Current Every Day Smoker -- 1.00 packs/day    Types: Cigarettes  . Smokeless tobacco: Never Used  . Alcohol Use: Yes     Comment: occasionally   OB History    No data available     Review of Systems  Constitutional: Positive for chills. Negative for  fever.  HENT: Negative for sore throat.   Eyes: Negative for photophobia, redness and visual disturbance.  Respiratory: Negative for cough and shortness of breath.   Cardiovascular: Negative for chest pain, palpitations and leg swelling.  Gastrointestinal: Positive for nausea, vomiting, abdominal pain and anorexia. Negative for diarrhea and constipation.  Genitourinary: Positive for vaginal bleeding. Negative for dysuria, urgency and frequency.  Musculoskeletal: Positive for back pain. Negative for neck pain.  Skin: Negative for rash.  Neurological: Positive for headaches. Negative for dizziness and syncope.  Psychiatric/Behavioral: Negative for confusion. The patient is not nervous/anxious.       Allergies  Acetaminophen  Home Medications   Prior to Admission medications   Medication Sig Start Date End Date Taking? Authorizing Provider  lisinopril (PRINIVIL,ZESTRIL) 20 MG tablet Take 20 mg by mouth daily.     Yes Historical Provider, MD  hydrocodone-ibuprofen (VICOPROFEN) 5-200 MG per tablet Take 1 tablet by mouth every 8 (eight) hours as needed for pain. 10/14/14   Sargun Rummell Orlene Och, NP  promethazine (PHENERGAN) 25 MG tablet Take 1 tablet (25 mg total) by mouth every 6 (six) hours as needed for nausea or vomiting. 10/14/14   Remas Sobel Orlene Och, NP   BP 157/104 mmHg  Pulse 63  Temp(Src) 98.3 F (36.8 C) (Oral)  Resp 18  Ht  (1.626 m)  Wt 159 lb (72.122 kg)  BMI 27.28 kg/m2  SpO2 98%  LMP 10/14/2014 Physical Exam  Constitutional: She is oriented to person, place, and time. She appears well-developed and well-nourished. No distress.  HENT:  Head: Normocephalic and atraumatic.  Eyes: EOM are normal.  Neck: Neck supple.  Cardiovascular: Normal rate and regular rhythm.   Pulmonary/Chest: Effort normal. No respiratory distress. Wheezes: occasional. She has no rales.  Abdominal: Soft. Bowel sounds are increased. There is tenderness in the right lower quadrant and left lower quadrant.  There is no rebound, no guarding and no CVA tenderness.  Tenderness with palpation is minimal  Genitourinary:  External genitalia without lesions, moderate blood vaginal vault. No CMT, no adnexal tenderness, uterus without palpable enlargement.   Musculoskeletal: Normal range of motion.  Neurological: She is alert and oriented to person, place, and time. No cranial nerve deficit.  Skin: Skin is warm and dry.  Psychiatric: She has a normal mood and affect. Her behavior is normal.  Nursing note and vitals reviewed.   ED Course  Procedures  Results for orders placed or performed during the hospital encounter of 10/14/14 (from the past 24 hour(s))  CBC with Differential     Status: Abnormal   Collection Time: 10/14/14  9:07 AM  Result Value Ref Range   WBC 3.3 (L) 4.0 - 10.5 K/uL   RBC 4.44 3.87 - 5.11 MIL/uL   Hemoglobin 11.2 (L) 12.0 - 15.0 g/dL   HCT 16.134.2 (L) 09.636.0 - 04.546.0 %   MCV 77.0 (L) 78.0 - 100.0 fL   MCH 25.2 (L) 26.0 - 34.0 pg   MCHC 32.7 30.0 - 36.0 g/dL   RDW 40.913.7 81.111.5 - 91.415.5 %   Platelets 141 (L) 150 - 400 K/uL   Neutrophils Relative % 66 43 - 77 %   Neutro Abs 2.2 1.7 - 7.7 K/uL   Lymphocytes Relative 19 12 - 46 %   Lymphs Abs 0.6 (L) 0.7 - 4.0 K/uL   Monocytes Relative 13 (H) 3 - 12 %   Monocytes Absolute 0.4 0.1 - 1.0 K/uL   Eosinophils Relative 2 0 - 5 %   Eosinophils Absolute 0.1 0.0 - 0.7 K/uL   Basophils Relative 0 0 - 1 %   Basophils Absolute 0.0 0.0 - 0.1 K/uL  Comprehensive metabolic panel     Status: Abnormal   Collection Time: 10/14/14  9:07 AM  Result Value Ref Range   Sodium 139 135 - 145 mmol/L   Potassium 4.0 3.5 - 5.1 mmol/L   Chloride 110 96 - 112 mEq/L   CO2 26 19 - 32 mmol/L   Glucose, Bld 87 70 - 99 mg/dL   BUN 10 6 - 23 mg/dL   Creatinine, Ser 7.820.88 0.50 - 1.10 mg/dL   Calcium 8.2 (L) 8.4 - 10.5 mg/dL   Total Protein 6.0 6.0 - 8.3 g/dL   Albumin 3.4 (L) 3.5 - 5.2 g/dL   AST 15 0 - 37 U/L   ALT 12 0 - 35 U/L   Alkaline Phosphatase 41 39 -  117 U/L   Total Bilirubin 0.9 0.3 - 1.2 mg/dL   GFR calc non Af Amer 84 (L) >90 mL/min   GFR calc Af Amer >90 >90 mL/min   Anion gap 3 (L) 5 - 15  Wet prep, genital     Status: Abnormal   Collection Time: 10/14/14  9:52 AM  Result Value Ref Range   Yeast Wet Prep HPF POC NONE SEEN NONE SEEN   Trich, Wet Prep NONE SEEN NONE SEEN   Clue Cells  Wet Prep HPF POC FEW (A) NONE SEEN   WBC, Wet Prep HPF POC FEW (A) NONE SEEN  Pregnancy, urine     Status: None   Collection Time: 10/14/14  1:35 PM  Result Value Ref Range   Preg Test, Ur NEGATIVE NEGATIVE  Urinalysis, Routine w reflex microscopic     Status: Abnormal   Collection Time: 10/14/14  1:35 PM  Result Value Ref Range   Color, Urine YELLOW YELLOW   APPearance CLEAR CLEAR   Specific Gravity, Urine >1.030 (H) 1.005 - 1.030   pH 5.5 5.0 - 8.0   Glucose, UA NEGATIVE NEGATIVE mg/dL   Hgb urine dipstick LARGE (A) NEGATIVE   Bilirubin Urine NEGATIVE NEGATIVE   Ketones, ur NEGATIVE NEGATIVE mg/dL   Protein, ur TRACE (A) NEGATIVE mg/dL   Urobilinogen, UA 0.2 0.0 - 1.0 mg/dL   Nitrite NEGATIVE NEGATIVE   Leukocytes, UA TRACE (A) NEGATIVE  Urine microscopic-add on     Status: Abnormal   Collection Time: 10/14/14  1:35 PM  Result Value Ref Range   Squamous Epithelial / LPF FEW (A) RARE   WBC, UA 0-2 <3 WBC/hpf   RBC / HPF TOO NUMEROUS TO COUNT <3 RBC/hpf   Bacteria, UA FEW (A) RARE    IV hydration, Zofran, labs After 1000 ccs of normal saline IV attempt to obtain cath urine from patient. Only able to get one drop of urine. Will administer a second 1000 ccs of IV fluid. Patient's nausea has improved with Zofran and pain has improved with Toradol 30 mg IV.  After second bag of NS and PO fluids patient is able to void. Urine sent. There is large blood in the urine due to menses.  Patient requesting additional pain medication. Dilaudid 1 mg. IV given.  Prior to d/c patient feeling much better, taking PO fluids and states she is ready to go  home.   MDM  39 y.o. female with lower abdominal cramping, nausea, vomiting and dehydration. Stable for discharge with symptoms much improved. Will d/c home with medication for nausea and se will follow up with Dr. Emelda Fear for her dysmenorrhea.   Final diagnoses:  Dysmenorrhea  Dehydration  Nausea and vomiting, vomiting of unspecified type     [facility], NP 10/14/14 1601  [name], MD 10/15/14 [phone]

## 2014-10-15 LAB — GC/CHLAMYDIA PROBE AMP (~~LOC~~) NOT AT ARMC
CHLAMYDIA, DNA PROBE: NEGATIVE
Neisseria Gonorrhea: NEGATIVE

## 2014-11-26 ENCOUNTER — Emergency Department (HOSPITAL_COMMUNITY): Payer: Managed Care, Other (non HMO)

## 2014-11-26 ENCOUNTER — Encounter (HOSPITAL_COMMUNITY): Payer: Self-pay

## 2014-11-26 ENCOUNTER — Emergency Department (HOSPITAL_COMMUNITY)
Admission: EM | Admit: 2014-11-26 | Discharge: 2014-11-26 | Disposition: A | Discharge status: Home / Self Care | Payer: Managed Care, Other (non HMO) | Attending: Emergency Medicine | Admitting: Emergency Medicine

## 2014-11-26 DIAGNOSIS — Z3202 Encounter for pregnancy test, result negative: Secondary | ICD-10-CM | POA: Diagnosis not present

## 2014-11-26 DIAGNOSIS — A5901 Trichomonal vulvovaginitis: Secondary | ICD-10-CM | POA: Insufficient documentation

## 2014-11-26 DIAGNOSIS — N760 Acute vaginitis: Secondary | ICD-10-CM | POA: Insufficient documentation

## 2014-11-26 DIAGNOSIS — I10 Essential (primary) hypertension: Secondary | ICD-10-CM | POA: Insufficient documentation

## 2014-11-26 DIAGNOSIS — B9689 Other specified bacterial agents as the cause of diseases classified elsewhere: Secondary | ICD-10-CM

## 2014-11-26 DIAGNOSIS — R109 Unspecified abdominal pain: Secondary | ICD-10-CM

## 2014-11-26 DIAGNOSIS — Z720 Tobacco use: Secondary | ICD-10-CM | POA: Insufficient documentation

## 2014-11-26 DIAGNOSIS — Z79899 Other long term (current) drug therapy: Secondary | ICD-10-CM | POA: Diagnosis not present

## 2014-11-26 DIAGNOSIS — R102 Pelvic and perineal pain: Secondary | ICD-10-CM

## 2014-11-26 DIAGNOSIS — R1030 Lower abdominal pain, unspecified: Secondary | ICD-10-CM | POA: Diagnosis present

## 2014-11-26 LAB — CBC WITH DIFFERENTIAL/PLATELET
BASOS PCT: 0 % (ref 0–1)
Basophils Absolute: 0 10*3/uL (ref 0.0–0.1)
Eosinophils Absolute: 0.1 10*3/uL (ref 0.0–0.7)
Eosinophils Relative: 1 % (ref 0–5)
HCT: 41.4 % (ref 36.0–46.0)
Hemoglobin: 13.7 g/dL (ref 12.0–15.0)
LYMPHS ABS: 3.1 10*3/uL (ref 0.7–4.0)
Lymphocytes Relative: 28 % (ref 12–46)
MCH: 25.1 pg — ABNORMAL LOW (ref 26.0–34.0)
MCHC: 33.1 g/dL (ref 30.0–36.0)
MCV: 75.8 fL — AB (ref 78.0–100.0)
Monocytes Absolute: 0.9 10*3/uL (ref 0.1–1.0)
Monocytes Relative: 8 % (ref 3–12)
Neutro Abs: 7.1 10*3/uL (ref 1.7–7.7)
Neutrophils Relative %: 63 % (ref 43–77)
Platelets: 248 10*3/uL (ref 150–400)
RBC: 5.46 MIL/uL — AB (ref 3.87–5.11)
RDW: 13.8 % (ref 11.5–15.5)
WBC: 11.3 10*3/uL — ABNORMAL HIGH (ref 4.0–10.5)

## 2014-11-26 LAB — PREGNANCY, URINE: Preg Test, Ur: NEGATIVE

## 2014-11-26 LAB — LIPASE, BLOOD: Lipase: 19 U/L (ref 11–59)

## 2014-11-26 LAB — URINALYSIS, ROUTINE W REFLEX MICROSCOPIC
BILIRUBIN URINE: NEGATIVE
Glucose, UA: NEGATIVE mg/dL
Hgb urine dipstick: NEGATIVE
KETONES UR: NEGATIVE mg/dL
NITRITE: NEGATIVE
PROTEIN: NEGATIVE mg/dL
Specific Gravity, Urine: >1.03 — ABNORMAL HIGH (ref 1.005–1.030)
UROBILINOGEN UA: 0.2 mg/dL (ref 0.0–1.0)
pH: 5.5 (ref 5.0–8.0)

## 2014-11-26 LAB — COMPREHENSIVE METABOLIC PANEL
ALBUMIN: 4.5 g/dL (ref 3.5–5.2)
ALT: 13 U/L (ref 0–35)
AST: 18 U/L (ref 0–37)
Alkaline Phosphatase: 54 U/L (ref 39–117)
Anion gap: 9 (ref 5–15)
BILIRUBIN TOTAL: 0.5 mg/dL (ref 0.3–1.2)
BUN: 10 mg/dL (ref 6–23)
CO2: 24 mmol/L (ref 19–32)
Calcium: 9.5 mg/dL (ref 8.4–10.5)
Chloride: 105 mmol/L (ref 96–112)
Creatinine, Ser: 1.08 mg/dL (ref 0.50–1.10)
GFR calc Af Amer: 76 mL/min — ABNORMAL LOW (ref >90)
GFR calc non Af Amer: 65 mL/min — ABNORMAL LOW (ref >90)
GLUCOSE: 97 mg/dL (ref 70–99)
Potassium: 3.6 mmol/L (ref 3.5–5.1)
SODIUM: 138 mmol/L (ref 135–145)
TOTAL PROTEIN: 8.2 g/dL (ref 6.0–8.3)

## 2014-11-26 LAB — URINE MICROSCOPIC-ADD ON

## 2014-11-26 LAB — WET PREP, GENITAL: Yeast Wet Prep HPF POC: NONE SEEN

## 2014-11-26 MED ORDER — METRONIDAZOLE 500 MG PO TABS: 500.0000 mg | ORAL_TABLET | Freq: Two times a day (BID) | ORAL | Status: DC

## 2014-11-26 MED ORDER — OXYCODONE HCL 5 MG PO TABS
5.0000 mg | ORAL_TABLET | Freq: Once | ORAL | Status: AC
  Administered 2014-11-26: 5 mg via ORAL
  Filled 2014-11-26: qty 1

## 2014-11-26 MED ORDER — OXYCODONE HCL 5 MG PO TABS: 5.0000 mg | ORAL_TABLET | Freq: Four times a day (QID) | ORAL | Status: DC | PRN

## 2014-11-26 NOTE — ED Provider Notes (Signed)
8:00 AM  Assumed care from Dr. Preston FleetingGlick.  Pt is a 39 y.o. F who presents to ED with suprapubic pain radiating into L flank.  Has L adnexal tenderness on pelvic.  + for Trich and Clue cells.  Otherwise urine and labs are unremarkable. Patient is awaiting a pelvic ultrasound. Plan is to discharge home with Flagyl, pain medication, Dr. Emelda FearFerguson follow-up information.   8:15 AM  Pelvic US unremarkable. Patient reports her pain is improved. We'll discharge home with prescription for Percocet, Flagyl and outpatient OB/GYN follow-up. Discussed return precautions. Discussed the importance of having her partner tested and treated for Trichomonas as well in the absence of sexual activity for at least 1 week until both she and her partner have been treated. She verbalized understanding and is comfortable with this plan.  Layla MawKristen N Chasin Findling, DO 11/26/14 [phone]

## 2014-11-26 NOTE — ED Notes (Signed)
Patient states lower abdominal pain X2 days with a white discharge and odor.

## 2014-11-26 NOTE — ED Provider Notes (Signed)
CSN: [id number]     Arrival date & time 11/26/14  0411 History   First MD Initiated Contact with Patient 11/26/14 0413     Chief Complaint  Patient presents with  . Abdominal Pain     (Consider location/radiation/quality/duration/timing/severity/associated sxs/prior Treatment) Patient is a 39 y.o. female presenting with abdominal pain. The history is provided by the patient.  Abdominal Pain She complains of suprapubic crampy pain for the last 2 days. Pain is across the suprapubic area but radiates to the left flank area. She rates pain at 8/10. Nothing makes the pain better nothing makes it worse. She denies associated nausea, vomiting, diarrhea. She denies dysuria or urinary urgency or frequency. There has been a white vaginal discharge with an odor. She denies dyspareunia. There is been no fever, chills, sweats. She has had similar symptoms before. She was seen in the ED about one month ago and was diagnosed with trichomonas. Both she and her partner were treated. She states that she has been monogamous for the last 2 years and, as far she knows, relationship has been mutually monogamous. Her partner was recently diagnosed with herpes. She is status post tubal ligation.  Past Medical History  Diagnosis Date  . Hypertension    Past Surgical History  Procedure Laterality Date  . Hernia repair    . Cesarean section     No family history on file. History  Substance Use Topics  . Smoking status: Current Every Day Smoker -- 1.00 packs/day    Types: Cigarettes  . Smokeless tobacco: Never Used  . Alcohol Use: Yes     Comment: occasionally   OB History    No data available     Review of Systems  Gastrointestinal: Positive for abdominal pain.  All other systems reviewed and are negative.     Allergies  Acetaminophen  Home Medications   Prior to Admission medications   Medication Sig Start Date End Date Taking? Authorizing Provider  lisinopril (PRINIVIL,ZESTRIL) 20 MG tablet  Take 20 mg by mouth daily.     Yes Historical Provider, MD  hydrocodone-ibuprofen (VICOPROFEN) 5-200 MG per tablet Take 1 tablet by mouth every 8 (eight) hours as needed for pain. 10/14/14   Hope Orlene Och, NP  promethazine (PHENERGAN) 25 MG tablet Take 1 tablet (25 mg total) by mouth every 6 (six) hours as needed for nausea or vomiting. 10/14/14   Hope Orlene Och, NP   BP 161/107 mmHg  Pulse 91  Temp(Src) 98.1 F (36.7 C) (Oral)  Resp 16  Ht  (1.626 m)  Wt 160 lb (72.576 kg)  BMI 27.45 kg/m2  SpO2 100%  LMP 11/10/2014 Physical Exam  Nursing note and vitals reviewed.  39 year old female, resting comfortably and in no acute distress. Vital signs are significant for hypertension. Oxygen saturation is 100%, which is normal. Head is normocephalic and atraumatic. PERRLA, EOMI. Oropharynx is clear. Neck is nontender and supple without adenopathy or JVD. Back is nontender in the midline. There is mild left CVA tenderness. Lungs are clear without rales, wheezes, or rhonchi. Chest is nontender. Heart has regular rate and rhythm without murmur. Abdomen is soft, flat, with mild midline and right suprapubic tenderness and moderate left suprapubic tenderness. There is no rebound or guarding. There are no masses or hepatosplenomegaly and peristalsis is normoactive. Pelvic: Normal external female genitalia. Small amount of clear to white vaginal discharge present. Cervix is closed. No bleeding seen. On bimanual exam, fundus is top normal size with no  cervical motion tenderness. There is no right adnexal tenderness. There is exquisite left adnexal tenderness which seems to be focused around a normal sized left ovary. Extremities have no cyanosis or edema, full range of motion is present. Skin is warm and dry without rash. Neurologic: Mental status is normal, cranial nerves are intact, there are no motor or sensory deficits.  ED Course  Procedures (including critical care time) Labs Review Results for  orders placed or performed during the hospital encounter of 11/26/14  Wet prep, genital  Result Value Ref Range   Yeast Wet Prep HPF POC NONE SEEN NONE SEEN   Trich, Wet Prep MANY (A) NONE SEEN   Clue Cells Wet Prep HPF POC MANY (A) NONE SEEN   WBC, Wet Prep HPF POC MANY (A) NONE SEEN  Urinalysis, Routine w reflex microscopic  Result Value Ref Range   Color, Urine YELLOW YELLOW   APPearance CLEAR CLEAR   Specific Gravity, Urine >1.030 (H) 1.005 - 1.030   pH 5.5 5.0 - 8.0   Glucose, UA NEGATIVE NEGATIVE mg/dL   Hgb urine dipstick NEGATIVE NEGATIVE   Bilirubin Urine NEGATIVE NEGATIVE   Ketones, ur NEGATIVE NEGATIVE mg/dL   Protein, ur NEGATIVE NEGATIVE mg/dL   Urobilinogen, UA 0.2 0.0 - 1.0 mg/dL   Nitrite NEGATIVE NEGATIVE   Leukocytes, UA SMALL (A) NEGATIVE  Pregnancy, urine  Result Value Ref Range   Preg Test, Ur NEGATIVE NEGATIVE  Comprehensive metabolic panel  Result Value Ref Range   Sodium 138 135 - 145 mmol/L   Potassium 3.6 3.5 - 5.1 mmol/L   Chloride 105 96 - 112 mmol/L   CO2 24 19 - 32 mmol/L   Glucose, Bld 97 70 - 99 mg/dL   BUN 10 6 - 23 mg/dL   Creatinine, Ser 4.09 0.50 - 1.10 mg/dL   Calcium 9.5 8.4 - 81.1 mg/dL   Total Protein 8.2 6.0 - 8.3 g/dL   Albumin 4.5 3.5 - 5.2 g/dL   AST 18 0 - 37 U/L   ALT 13 0 - 35 U/L   Alkaline Phosphatase 54 39 - 117 U/L   Total Bilirubin 0.5 0.3 - 1.2 mg/dL   GFR calc non Af Amer 65 (L) >90 mL/min   GFR calc Af Amer 76 (L) >90 mL/min   Anion gap 9 5 - 15  Lipase, blood  Result Value Ref Range   Lipase 19 11 - 59 U/L  CBC with Differential  Result Value Ref Range   WBC 11.3 (H) 4.0 - 10.5 K/uL   RBC 5.46 (H) 3.87 - 5.11 MIL/uL   Hemoglobin 13.7 12.0 - 15.0 g/dL   HCT 91.4 78.2 - 95.6 %   MCV 75.8 (L) 78.0 - 100.0 fL   MCH 25.1 (L) 26.0 - 34.0 pg   MCHC 33.1 30.0 - 36.0 g/dL   RDW 21.3 08.6 - 57.8 %   Platelets 248 150 - 400 K/uL   Neutrophils Relative % 63 43 - 77 %   Neutro Abs 7.1 1.7 - 7.7 K/uL   Lymphocytes  Relative 28 12 - 46 %   Lymphs Abs 3.1 0.7 - 4.0 K/uL   Monocytes Relative 8 3 - 12 %   Monocytes Absolute 0.9 0.1 - 1.0 K/uL   Eosinophils Relative 1 0 - 5 %   Eosinophils Absolute 0.1 0.0 - 0.7 K/uL   Basophils Relative 0 0 - 1 %   Basophils Absolute 0.0 0.0 - 0.1 K/uL  Urine microscopic-add on  Result  Value Ref Range   Squamous Epithelial / LPF MANY (A) RARE   WBC, UA 21-50 <3 WBC/hpf   RBC / HPF 0-2 <3 RBC/hpf   Bacteria, UA MANY (A) RARE   MDM   Final diagnoses:  Pelvic pain in female  Trichomonas vaginitis  Bacterial vaginosis    Pelvic pain which seems to focus on the left ovary. Screening labs and cultures are obtained. Old records are reviewed and she does have 2 prior ED visits for similar complaints. She is given a dose of oxycodone in the ED. Anticipate she will need pelvic ultrasound.  Urinalysis is a contaminated specimen, but I doubt UTI. Wet prep is positive for trichomonas and many clue cells. Patient states that she had used single dose treatment of both herself and her partner to treat Trichomonas vaginitis previously. This time, treatment will be given for one week with metronidazole 500 mg twice a day. Pelvic ultrasound is ordered. Case is signed out to Dr. Elesa MassedWard to evaluate that result.  Dione Boozeavid Bolivar Koranda, MD 11/26/14 [phone]

## 2014-11-26 NOTE — ED Notes (Signed)
Pt reports lower abd/pelvic pain x 3 days and vaginal discharge that started today.

## 2014-11-26 NOTE — Discharge Instructions (Signed)
Bacterial Vaginosis °Bacterial vaginosis is a vaginal infection that occurs when the normal balance of bacteria in the vagina is disrupted. It results from an overgrowth of certain bacteria. This is the most common vaginal infection in women of childbearing age. Treatment is important to prevent complications, especially in pregnant women, as it can cause a premature delivery. °CAUSES  °Bacterial vaginosis is caused by an increase in harmful bacteria that are normally present in smaller amounts in the vagina. Several different kinds of bacteria can cause bacterial vaginosis. However, the reason that the condition develops is not fully understood. °RISK FACTORS °Certain activities or behaviors can put you at an increased risk of developing bacterial vaginosis, including: °· Having a new sex partner or multiple sex partners. °· Douching. °· Using an intrauterine device (IUD) for contraception. °Women do not get bacterial vaginosis from toilet seats, bedding, swimming pools, or contact with objects around them. °SIGNS AND SYMPTOMS  °Some women with bacterial vaginosis have no signs or symptoms. Common symptoms include: °· Grey vaginal discharge. °· A fishlike odor with discharge, especially after sexual intercourse. °· Itching or burning of the vagina and vulva. °· Burning or pain with urination. °DIAGNOSIS  °Your health care provider will take a medical history and examine the vagina for signs of bacterial vaginosis. A sample of vaginal fluid may be taken. Your health care provider will look at this sample under a microscope to check for bacteria and abnormal cells. A vaginal pH test may also be done.  °TREATMENT  °Bacterial vaginosis may be treated with antibiotic medicines. These may be given in the form of a pill or a vaginal cream. A second round of antibiotics may be prescribed if the condition comes back after treatment.  °HOME CARE INSTRUCTIONS  °· Only take over-the-counter or prescription medicines as  directed by your health care provider. °· If antibiotic medicine was prescribed, take it as directed. Make sure you finish it even if you start to feel better. °· Do not have sex until treatment is completed. °· Tell all sexual partners that you have a vaginal infection. They should see their health care provider and be treated if they have problems, such as a mild rash or itching. °· Practice safe sex by using condoms and only having one sex partner. °SEEK MEDICAL CARE IF:  °· Your symptoms are not improving after 3 days of treatment. °· You have increased discharge or pain. °· You have a fever. °MAKE SURE YOU:  °· Understand these instructions. °· Will watch your condition. °· Will get help right away if you are not doing well or get worse. °FOR MORE INFORMATION  °Centers for Disease Control and Prevention, Division of STD Prevention: www.cdc.gov/std °American Sexual Health Association (ASHA): www.ashastd.org  °Document Released: 09/17/2005 Document Revised: 07/08/2013 Document Reviewed: 04/29/2013 °ExitCare® Patient Information ©2015 [employer]. This information is not intended to replace advice given to you by your health care provider. Make sure you discuss any questions you have with your health care provider. °Trichomoniasis °Trichomoniasis is an infection caused by an organism called Trichomonas. The infection can affect both women and men. In women, the outer female genitalia and the vagina are affected. In men, the penis is mainly affected, but the prostate and other reproductive organs can also be involved. Trichomoniasis is a sexually transmitted infection (STI) and is most often passed to another person through sexual contact.  °RISK FACTORS °· Having unprotected sexual intercourse. °· Having sexual intercourse with an infected partner. °SIGNS AND SYMPTOMS  °  Symptoms of trichomoniasis in women include: °· Abnormal gray-green frothy vaginal discharge. °· Itching and irritation of the  vagina. °· Itching and irritation of the area outside the vagina. °Symptoms of trichomoniasis in men include:  °· Penile discharge with or without pain. °· Pain during urination. This results from inflammation of the urethra. °DIAGNOSIS  °Trichomoniasis may be found during a Pap test or physical exam. Your health care provider may use one of the following methods to help diagnose this infection: °· Examining vaginal discharge under a microscope. For men, urethral discharge would be examined. °· Testing the pH of the vagina with a test tape. °· Using a vaginal swab test that checks for the Trichomonas organism. A test is available that provides results within a few minutes. °· Doing a culture test for the organism. This is not usually needed. °TREATMENT  °· You may be given medicine to fight the infection. Women should inform their health care provider if they could be or are pregnant. Some medicines used to treat the infection should not be taken during pregnancy. °· Your health care provider may recommend over-the-counter medicines or creams to decrease itching or irritation. °· Your sexual partner will need to be treated if infected. °HOME CARE INSTRUCTIONS  °· Take medicines only as directed by your health care provider. °· Take over-the-counter medicine for itching or irritation as directed by your health care provider. °· Do not have sexual intercourse while you have the infection. °· Women should not douche or wear tampons while they have the infection. °· Discuss your infection with your partner. Your partner may have gotten the infection from you, or you may have gotten it from your partner. °· Have your sex partner get examined and treated if necessary. °· Practice safe, informed, and protected sex. °· See your health care provider for other STI testing. °SEEK MEDICAL CARE IF:  °· You still have symptoms after you finish your medicine. °· You develop abdominal pain. °· You have pain when you urinate. °· You  have bleeding after sexual intercourse. °· You develop a rash. °· Your medicine makes you sick or makes you throw up (vomit). °MAKE SURE YOU: °· Understand these instructions. °· Will watch your condition. °· Will get help right away if you are not doing well or get worse. °Document Released: 03/13/2001 Document Revised: 02/01/2014 Document Reviewed: 06/29/2013 °ExitCare® Patient Information ©2015 [employer]. This information is not intended to replace advice given to you by your health care provider. Make sure you discuss any questions you have with your health care provider. ° °

## 2014-11-27 LAB — HIV ANTIBODY (ROUTINE TESTING W REFLEX): HIV SCREEN 4TH GENERATION: NONREACTIVE

## 2014-11-27 LAB — RPR: RPR Ser Ql: NONREACTIVE

## 2014-11-29 LAB — GC/CHLAMYDIA PROBE AMP (~~LOC~~) NOT AT ARMC
Chlamydia: NEGATIVE
NEISSERIA GONORRHEA: NEGATIVE

## 2014-12-15 ENCOUNTER — Emergency Department (HOSPITAL_COMMUNITY)
Admission: EM | Admit: 2014-12-15 | Discharge: 2014-12-15 | Disposition: A | Discharge status: Other Acute Inpatient Hospital | Payer: Managed Care, Other (non HMO) | Attending: Psychiatry | Admitting: Psychiatry

## 2014-12-15 ENCOUNTER — Inpatient Hospital Stay (HOSPITAL_COMMUNITY)
Admission: AD | Admit: 2014-12-15 | Discharge: 2014-12-18 | DRG: 885 | Disposition: A | Discharge status: Home / Self Care | Payer: 59 | Source: Intra-hospital | Attending: Psychiatry | Admitting: Psychiatry

## 2014-12-15 ENCOUNTER — Encounter (HOSPITAL_COMMUNITY): Payer: Self-pay | Admitting: *Deleted

## 2014-12-15 DIAGNOSIS — I10 Essential (primary) hypertension: Secondary | ICD-10-CM | POA: Diagnosis not present

## 2014-12-15 DIAGNOSIS — F1210 Cannabis abuse, uncomplicated: Secondary | ICD-10-CM | POA: Diagnosis not present

## 2014-12-15 DIAGNOSIS — Z79899 Other long term (current) drug therapy: Secondary | ICD-10-CM | POA: Insufficient documentation

## 2014-12-15 DIAGNOSIS — F32A Depression, unspecified: Secondary | ICD-10-CM

## 2014-12-15 DIAGNOSIS — F329 Major depressive disorder, single episode, unspecified: Secondary | ICD-10-CM | POA: Diagnosis present

## 2014-12-15 DIAGNOSIS — Z720 Tobacco use: Secondary | ICD-10-CM | POA: Insufficient documentation

## 2014-12-15 DIAGNOSIS — F332 Major depressive disorder, recurrent severe without psychotic features: Principal | ICD-10-CM | POA: Diagnosis present

## 2014-12-15 LAB — CBC WITH DIFFERENTIAL/PLATELET
Basophils Absolute: 0 10*3/uL (ref 0.0–0.1)
Basophils Relative: 1 % (ref 0–1)
EOS PCT: 1 % (ref 0–5)
Eosinophils Absolute: 0.1 10*3/uL (ref 0.0–0.7)
HCT: 39 % (ref 36.0–46.0)
Hemoglobin: 12.7 g/dL (ref 12.0–15.0)
LYMPHS ABS: 2 10*3/uL (ref 0.7–4.0)
LYMPHS PCT: 34 % (ref 12–46)
MCH: 24.8 pg — AB (ref 26.0–34.0)
MCHC: 32.6 g/dL (ref 30.0–36.0)
MCV: 76.2 fL — ABNORMAL LOW (ref 78.0–100.0)
Monocytes Absolute: 0.5 10*3/uL (ref 0.1–1.0)
Monocytes Relative: 9 % (ref 3–12)
NEUTROS PCT: 55 % (ref 43–77)
Neutro Abs: 3.2 10*3/uL (ref 1.7–7.7)
PLATELETS: 224 10*3/uL (ref 150–400)
RBC: 5.12 MIL/uL — ABNORMAL HIGH (ref 3.87–5.11)
RDW: 13.8 % (ref 11.5–15.5)
WBC: 5.8 10*3/uL (ref 4.0–10.5)

## 2014-12-15 LAB — SALICYLATE LEVEL: Salicylate Lvl: <4 mg/dL (ref 2.8–20.0)

## 2014-12-15 LAB — COMPREHENSIVE METABOLIC PANEL
ALT: 11 U/L (ref 0–35)
AST: 19 U/L (ref 0–37)
Albumin: 3.9 g/dL (ref 3.5–5.2)
Alkaline Phosphatase: 46 U/L (ref 39–117)
Anion gap: 7 (ref 5–15)
BILIRUBIN TOTAL: 0.7 mg/dL (ref 0.3–1.2)
BUN: 8 mg/dL (ref 6–23)
CHLORIDE: 108 mmol/L (ref 96–112)
CO2: 24 mmol/L (ref 19–32)
Calcium: 9.1 mg/dL (ref 8.4–10.5)
Creatinine, Ser: 0.96 mg/dL (ref 0.50–1.10)
GFR, EST AFRICAN AMERICAN: 87 mL/min — AB (ref >90)
GFR, EST NON AFRICAN AMERICAN: 75 mL/min — AB (ref >90)
GLUCOSE: 96 mg/dL (ref 70–99)
POTASSIUM: 3.8 mmol/L (ref 3.5–5.1)
Sodium: 139 mmol/L (ref 135–145)
TOTAL PROTEIN: 7 g/dL (ref 6.0–8.3)

## 2014-12-15 LAB — RAPID URINE DRUG SCREEN, HOSP PERFORMED
AMPHETAMINES: NOT DETECTED
BENZODIAZEPINES: NOT DETECTED
Barbiturates: NOT DETECTED
COCAINE: NOT DETECTED
Opiates: NOT DETECTED
TETRAHYDROCANNABINOL: POSITIVE — AB

## 2014-12-15 LAB — ACETAMINOPHEN LEVEL: Acetaminophen (Tylenol), Serum: <10 ug/mL — ABNORMAL LOW (ref 10–30)

## 2014-12-15 LAB — ETHANOL: Alcohol, Ethyl (B): <5 mg/dL (ref 0–9)

## 2014-12-15 NOTE — ED Notes (Signed)
[facility] voluntary commitant [facility]. [facility] [employer] [facility].

## 2014-12-15 NOTE — ED Notes (Signed)
Pt being beligirent and argumentative, stating that she doesn't want to live any more - but everyone has days like that  . Also stated that if she did want to hurt herself then she wouldn't have called her family . Lying on her side, crying at this time. Refusing to change clothes.

## 2014-12-15 NOTE — BH Assessment (Signed)
Per Dr. Adriana Simasook pt is avoiding questions.She is brought in by daughter and friend. She took morphine tablets, possibly 2-3. She has hx of depression. She told nurse "if I really wanted to kill myself I would have done it by now." Per Dr. Adriana Simasook pt is evasive and reports she does not want any help.   Ava previously requested cart be placed in the room for assessment.   Assessment to commence shortly.
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
## 2014-12-15 NOTE — ED Provider Notes (Signed)
CSN: [id number]     Arrival date & time 12/15/14  1954 History   First MD Initiated Contact with Patient 12/15/14 2004     No chief complaint on file.    (Consider location/radiation/quality/duration/timing/severity/associated sxs/prior Treatment) HPI.... Level V caveat for urgent need for intervention.   Patient allegedly took several morphine 15 mg tablets. She is sad and depressed. She has suffered from depression for a long time. She states " I don't want to live anymore, but I'm not suicidal.".  She was brought to the emergency department by her friend and her daughter who are concerned about her.  Past Medical History  Diagnosis Date  . Hypertension    Past Surgical History  Procedure Laterality Date  . Hernia repair    . Cesarean section     History reviewed. No pertinent family history. History  Substance Use Topics  . Smoking status: Current Every Day Smoker -- 1.00 packs/day    Types: Cigarettes  . Smokeless tobacco: Never Used  . Alcohol Use: Yes     Comment: occasionally   OB History    No data available     Review of Systems  Unable to perform ROS: Psychiatric disorder      Allergies  Acetaminophen  Home Medications   Prior to Admission medications   Medication Sig Start Date End Date Taking? Authorizing Provider  Aspirin-Salicylamide-Caffeine (BC HEADACHE POWDER PO) Take 1 Package by mouth daily as needed (headache).   Yes Historical Provider, MD  lisinopril (PRINIVIL,ZESTRIL) 20 MG tablet Take 20 mg by mouth daily.     Yes Historical Provider, MD  hydrocodone-ibuprofen (VICOPROFEN) 5-200 MG per tablet Take 1 tablet by mouth every 8 (eight) hours as needed for pain. Patient not taking: Reported on 12/15/2014 10/14/14   Janne Napoleon, NP  metroNIDAZOLE (FLAGYL) 500 MG tablet Take 1 tablet (500 mg total) by mouth 2 (two) times daily. Patient not taking: Reported on 12/15/2014 11/26/14   Layla Maw Ward, DO  oxyCODONE (ROXICODONE) 5 MG immediate release tablet  Take 1 tablet (5 mg total) by mouth every 6 (six) hours as needed for severe pain. Patient not taking: Reported on 12/15/2014 11/26/14   Layla Maw Ward, DO  promethazine (PHENERGAN) 25 MG tablet Take 1 tablet (25 mg total) by mouth every 6 (six) hours as needed for nausea or vomiting. Patient not taking: Reported on 12/15/2014 10/14/14   Janne Napoleon, NP   BP 151/89 mmHg  Pulse 82  Temp(Src) 98.1 F (36.7 C) (Oral)  Resp 20  SpO2 100%  LMP 12/15/2014 Physical Exam  Constitutional: She is oriented to person, place, and time. She appears well-developed and well-nourished.  HENT:  Head: Normocephalic and atraumatic.  Eyes: Conjunctivae and EOM are normal. Pupils are equal, round, and reactive to light.  Neck: Normal range of motion. Neck supple.  Cardiovascular: Normal rate and regular rhythm.   Pulmonary/Chest: Effort normal and breath sounds normal.  Abdominal: Soft. Bowel sounds are normal.  Musculoskeletal: Normal range of motion.  Neurological: She is alert and oriented to person, place, and time.  Skin: Skin is warm and dry.  Psychiatric:  Depressed, flat affect  Nursing note and vitals reviewed.   ED Course  Procedures (including critical care time) Labs Review Labs Reviewed  CBC WITH DIFFERENTIAL/PLATELET - Abnormal; Notable for the following:    RBC 5.12 (*)    MCV 76.2 (*)    MCH 24.8 (*)    All other components within normal limits  COMPREHENSIVE  METABOLIC PANEL - Abnormal; Notable for the following:    GFR calc non Af Amer 75 (*)    GFR calc Af Amer 87 (*)    All other components within normal limits  ACETAMINOPHEN LEVEL - Abnormal; Notable for the following:    Acetaminophen (Tylenol), Serum <10.0 (*)    All other components within normal limits  ETHANOL  SALICYLATE LEVEL  URINE RAPID DRUG SCREEN (HOSP PERFORMED)    Imaging Review No results found.   EKG Interpretation None      MDM   Final diagnoses:  Depression    Patient is depressed and has  allegedly consumed opiates inappropriately. Discussed with behavioral health. Admit.    Donnetta HutchingBrian Homer Miller, MD 12/15/14 [id number]

## 2014-12-15 NOTE — ED Notes (Addendum)
Family says pt took overdose, Pt denies, says she took some pills and does not know what they are, alert, NAD at present.  Pt says she does not want to live anymore.  Will not say what pills she took.. Dr Adriana Simasook in with pt

## 2014-12-16 DIAGNOSIS — F332 Major depressive disorder, recurrent severe without psychotic features: Secondary | ICD-10-CM | POA: Diagnosis present

## 2014-12-16 LAB — LIPID PANEL
CHOLESTEROL: 172 mg/dL (ref 0–200)
HDL: 24 mg/dL — ABNORMAL LOW (ref >39)
LDL Cholesterol: 128 mg/dL — ABNORMAL HIGH (ref 0–99)
Total CHOL/HDL Ratio: 7.2 RATIO
Triglycerides: 102 mg/dL (ref <150)
VLDL: 20 mg/dL (ref 0–40)

## 2014-12-16 LAB — TSH: TSH: 1.607 u[IU]/mL (ref 0.350–4.500)

## 2014-12-16 MED ORDER — FLUOXETINE HCL 20 MG PO CAPS
20.0000 mg | ORAL_CAPSULE | Freq: Every day | ORAL | Status: DC
  Administered 2014-12-16: 20 mg via ORAL
  Filled 2014-12-16 (×3): qty 1

## 2014-12-16 MED ORDER — HYDROXYZINE HCL 25 MG PO TABS
25.0000 mg | ORAL_TABLET | Freq: Four times a day (QID) | ORAL | Status: DC | PRN
  Filled 2014-12-16: qty 1

## 2014-12-16 MED ORDER — ALUM & MAG HYDROXIDE-SIMETH 200-200-20 MG/5ML PO SUSP: 30.0000 mL | ORAL | Status: DC | PRN

## 2014-12-16 MED ORDER — MAGNESIUM HYDROXIDE 400 MG/5ML PO SUSP: 30.0000 mL | Freq: Every day | ORAL | Status: DC | PRN

## 2014-12-16 MED ORDER — AMLODIPINE BESYLATE 5 MG PO TABS
5.0000 mg | ORAL_TABLET | Freq: Every day | ORAL | Status: DC
  Administered 2014-12-16 – 2014-12-18 (×3): 5 mg via ORAL
  Filled 2014-12-16 (×5): qty 1

## 2014-12-16 MED ORDER — TRAZODONE HCL 50 MG PO TABS
50.0000 mg | ORAL_TABLET | Freq: Every evening | ORAL | Status: DC | PRN
  Filled 2014-12-16 (×7): qty 1

## 2014-12-16 MED ORDER — LISINOPRIL 20 MG PO TABS
20.0000 mg | ORAL_TABLET | Freq: Every day | ORAL | Status: DC
  Administered 2014-12-16 – 2014-12-18 (×3): 20 mg via ORAL
  Filled 2014-12-16 (×5): qty 1

## 2014-12-16 MED ORDER — NAPROXEN 500 MG PO TABS: 500.0000 mg | ORAL_TABLET | Freq: Two times a day (BID) | ORAL | Status: DC | PRN

## 2014-12-16 NOTE — H&P (Signed)
Psychiatric Admission Assessment Adult  Patient Identification: Katherine Ward MRN:  [id number] Date of Evaluation:  12/16/2014 Chief Complaint:  MDD Principal Diagnosis: <principal problem not specified> Diagnosis:   Patient Active Problem List   Diagnosis Date Noted  . MDD (major depressive disorder), recurrent episode, severe [F33.2] 12/16/2014  . Suicidal thoughts [R45.851] 04/09/2014   History of Present Illness:: 39 year old female, was taken to hospital by daughter and friend, because she was becoming more depressed and yesterday  had overdosed on morphine ( which had been prescribed for her in the past but she had  not been taking) . States " I don't know what I was doing, just wanted to ease the pain". States " I probably passed out" and has little recollection of events until family brought her to hospital. She states she has been depressed for a long time ( " years"), but it has been worsening. She reports symptoms of depression as below, and also has been progressively more socially isolated .   Elements:  Worsening depression , with recent severe neuro-vegetative symptoms,  Recent overdose on opiates, although states not sure if intent was suicidal. Severe psychosocial stressors  Associated Signs/Symptoms: Depression Symptoms:  depressed mood, anhedonia, insomnia, fatigue, feelings of worthlessness/guilt, suicidal thoughts with specific plan, anxiety, decreased appetite, (Hypo) Manic Symptoms:  Denies  Anxiety Symptoms:  States she worries excessively, denies panic attacks.  Psychotic Symptoms:  Denies hallucinations PTSD Symptoms: Some recurrent ruminations about brother's suicide by firearm, which she witnessed .  Describes some increased startle response  Total Time spent with patient: 45 minutes   Past Psychiatric History- has never been in a psychiatric hospital before, had not attempted suicide in the past, but has had suicidal thoughts in the past, denies psychosis,  denies mania, some PTSD symptoms from witnessing brother's death in 36.  Has never been on any psychiatric medications/antidepressants.  States she worries excessively as well.   Past Medical History:  Denies medical illnesses other than HTN. Recently stopped smoking . Past Medical History  Diagnosis Date  . Hypertension     Past Surgical History  Procedure Laterality Date  . Hernia repair    . Cesarean section     Family History:  Parents alive, patient lives with mother, good relationship with both her parents, has  One  Surviving brother, another brother committed suicide back in 1997.  (+) alcohol dependence in members of extended family ( father's side of family) , and history of depression ( brother )  Social History:  Married x 18 years , currently  Going through divorce, has four children, ages 20,19,8 , 54. The two minors are currently with their older sibling. Patient is employed, states work is not a major stressor, some financial stressors, currently lives with mother , denies legal issues . History  Alcohol Use  . Yes    Comment: occasionally     History  Drug Use  . Yes  . Special: Marijuana    History   Social History  . Marital Status: Legally Separated    Spouse Name: N/A  . Number of Children: N/A  . Years of Education: N/A   Social History Main Topics  . Smoking status: Current Every Day Smoker -- 1.00 packs/day    Types: Cigarettes  . Smokeless tobacco: Never Used  . Alcohol Use: Yes     Comment: occasionally  . Drug Use: Yes    Special: Marijuana  . Sexual Activity: Yes    Birth Control/  Protection: None   Other Topics Concern  . None   Social History Narrative   Additional Social History:    Pain Medications: reports she took 2-3 morphine tablets today, reports she had never taken them before  Prescriptions:  SEE PTA, denies Over the Counter: denies History of alcohol / drug use?: Yes Name of Substance 1: THC 1 - Age of First Use: 18 1 -  Amount (size/oz): unknown 1 - Frequency: a couple of times a month 1 - Duration: about 20 years 1 - Last Use / Amount: uncertain  Name of Substance 2: etoh  2 - Age of First Use: 23 2 - Amount (size/oz): 1-2 drinks 2 - Frequency: less than once a month  2 - Duration: 15 years  2 - Last Use / Amount: uncertain     Musculoskeletal: Strength & Muscle Tone: within normal limits Gait & Station: normal Patient leans: N/A  Psychiatric Specialty Exam: Physical Exam  Review of Systems  Constitutional: Negative for fever and chills.  Eyes: Negative.   Respiratory: Negative for cough and shortness of breath.   Cardiovascular: Negative for chest pain.  Gastrointestinal: Negative for nausea, vomiting, diarrhea and blood in stool.  Genitourinary: Negative for dysuria, urgency and frequency.  Musculoskeletal: Negative.        Back pain   Skin: Negative for rash.  Neurological: Negative for seizures and headaches.  Psychiatric/Behavioral: Positive for depression and suicidal ideas. Negative for hallucinations.    Blood pressure 163/112, pulse 82, temperature 98.7 F (37.1 C), temperature source Oral, resp. rate 20, height 5\' 4"  (1.626 m), weight 145 lb (65.772 kg), last menstrual period 12/15/2014.Body mass index is 24.88 kg/(m^2).  General Appearance: Fairly Groomed  Patent attorney::  Fair  Speech:  Normal Rate  Volume:  Decreased  Mood:  Depressed  Affect:  Constricted, Depressed and Tearful  Thought Process:  Goal Directed and Linear  Orientation:  Full (Time, Place, and Person)  Thought Content:  no delusions , no hallucinations, does not appear internally preoccupied   Suicidal Thoughts:  No- at this time denies any thoughts of hurting self and  contracts for safety on unit   Homicidal Thoughts:  No  Memory:   Recent and remote grossly intact   Judgement:  Fair  Insight:  Fair  Psychomotor Activity:  Decreased  Concentration:  Good  Recall:  Good  Fund of Knowledge:Good   Language: Good  Akathisia:  Negative  Handed:  Right  AIMS (if indicated):     Assets:  Communication Skills Desire for Improvement Physical Health Resilience Vocational/Educational  ADL's:   Impaired   Cognition: WNL  Sleep:  Number of Hours: 4.5   Risk to Self: Is patient at risk for suicide?: Yes Risk to Others:   Prior Inpatient Therapy:   Prior Outpatient Therapy:    Alcohol Screening: 1. How often do you have a drink containing alcohol?: Monthly or less 2. How many drinks containing alcohol do you have on a typical day when you are drinking?: 1 or 2 3. How often do you have six or more drinks on one occasion?: Never Preliminary Score: 0 4. How often during the last year have you found that you were not able to stop drinking once you had started?: Never 5. How often during the last year have you failed to do what was normally expected from you becasue of drinking?: Never 6. How often during the last year have you needed a first drink in the morning to get  yourself going after a heavy drinking session?: Never 7. How often during the last year have you had a feeling of guilt of remorse after drinking?: Never 8. How often during the last year have you been unable to remember what happened the night before because you had been drinking?: Never 9. Have you or someone else been injured as a result of your drinking?: No 10. Has a relative or friend or a doctor or another health worker been concerned about your drinking or suggested you cut down?: No Alcohol Use Disorder Identification Test Final Score (AUDIT): 1 Brief Intervention: AUDIT score less than 7 or less-screening does not suggest unhealthy drinking-brief intervention not indicated  Allergies:   Allergies  Allergen Reactions  . Acetaminophen     Gives patient a very high fever   Lab Results:  Results for orders placed or performed during the hospital encounter of 12/15/14 (from the past 48 hour(s))  Lipid panel,  fasting     Status: Abnormal   Collection Time: 12/16/14  6:55 AM  Result Value Ref Range   Cholesterol 172 0 - 200 mg/dL   Triglycerides 914102 <782<150 mg/dL   HDL 24 (L) >95>39 mg/dL   Total CHOL/HDL Ratio 7.2 RATIO   VLDL 20 0 - 40 mg/dL   LDL Cholesterol 621128 (H) 0 - 99 mg/dL    Comment:        Total Cholesterol/HDL:CHD Risk Coronary Heart Disease Risk Table                     Men   Women  1/2 Average Risk   3.4   3.3  Average Risk       5.0   4.4  2 X Average Risk   9.6   7.1  3 X Average Risk  23.4   11.0        Use the calculated Patient Ratio above and the CHD Risk Table to determine the patient's CHD Risk.        ATP III CLASSIFICATION (LDL):  <100     mg/dL   Optimal  308-657100-129  mg/dL   Near or Above                    Optimal  130-159  mg/dL   Borderline  [id number]  mg/dL   High  >952>190     mg/dL   Very High Performed at [facility]   TSH     Status: None   Collection Time: 12/16/14  6:55 AM  Result Value Ref Range   TSH 1.607 0.350 - 4.500 uIU/mL    Comment: Performed at [facility]   Current Medications: Current Facility-Administered Medications  Medication Dose Route Frequency Provider Last Rate Last Dose  . alum & mag hydroxide-simeth (MAALOX/MYLANTA) 200-200-20 MG/5ML suspension 30 mL  30 mL Oral Q4H PRN Kerry HoughSpencer E Simon, PA-C      . amLODipine (NORVASC) tablet 5 mg  5 mg Oral Daily Kerry HoughSpencer E Simon, PA-C   5 mg at 12/16/14 0704  . hydrOXYzine (ATARAX/VISTARIL) tablet 25 mg  25 mg Oral Q6H PRN Kerry HoughSpencer E Simon, PA-C      . lisinopril (PRINIVIL,ZESTRIL) tablet 20 mg  20 mg Oral Daily Kerry HoughSpencer E Simon, PA-C   20 mg at 12/16/14 0704  . magnesium hydroxide (MILK OF MAGNESIA) suspension 30 mL  30 mL Oral Daily PRN Kerry HoughSpencer E Simon, PA-C      . naproxen (NAPROSYN) tablet 500  mg  500 mg Oral BID PRN Kerry Hough, PA-C      . traZODone (DESYREL) tablet 50 mg  50 mg Oral QHS,MR X 1 Spencer E Simon, PA-C   50 mg at 12/16/14 0100   PTA  Medications: Prescriptions prior to admission  Medication Sig Dispense Refill Last Dose  . Aspirin-Salicylamide-Caffeine (BC HEADACHE POWDER PO) Take 1 Package by mouth daily as needed (headache).   12/13/2014  . lisinopril (PRINIVIL,ZESTRIL) 20 MG tablet Take 20 mg by mouth daily.     12/14/2014  . hydrocodone-ibuprofen (VICOPROFEN) 5-200 MG per tablet Take 1 tablet by mouth every 8 (eight) hours as needed for pain. (Patient not taking: Reported on 12/15/2014) 15 tablet 0   . metroNIDAZOLE (FLAGYL) 500 MG tablet Take 1 tablet (500 mg total) by mouth 2 (two) times daily. (Patient not taking: Reported on 12/15/2014) 14 tablet 0   . oxyCODONE (ROXICODONE) 5 MG immediate release tablet Take 1 tablet (5 mg total) by mouth every 6 (six) hours as needed for severe pain. (Patient not taking: Reported on 12/15/2014) 15 tablet 0   . promethazine (PHENERGAN) 25 MG tablet Take 1 tablet (25 mg total) by mouth every 6 (six) hours as needed for nausea or vomiting. (Patient not taking: Reported on 12/15/2014) 30 tablet 0     Previous Psychotropic Medications: No   Substance Abuse History in the last 12 months:  No.- Denies alcohol abuse or dependence, denies drug abuse , denies abusing prescribed medications    Consequences of Substance Abuse: NA  Results for orders placed or performed during the hospital encounter of 12/15/14 (from the past 72 hour(s))  Lipid panel, fasting     Status: Abnormal   Collection Time: 12/16/14  6:55 AM  Result Value Ref Range   Cholesterol 172 0 - 200 mg/dL   Triglycerides 161 <096 mg/dL   HDL 24 (L) >04 mg/dL   Total CHOL/HDL Ratio 7.2 RATIO   VLDL 20 0 - 40 mg/dL   LDL Cholesterol 540 (H) 0 - 99 mg/dL    Comment:        Total Cholesterol/HDL:CHD Risk Coronary Heart Disease Risk Table                     Men   Women  1/2 Average Risk   3.4   3.3  Average Risk       5.0   4.4  2 X Average Risk   9.6   7.1  3 X Average Risk  23.4   11.0        Use the calculated Patient  Ratio above and the CHD Risk Table to determine the patient's CHD Risk.        ATP III CLASSIFICATION (LDL):  <100     mg/dL   Optimal  981-191  mg/dL   Near or Above                    Optimal  130-159  mg/dL   Borderline  478-295  mg/dL   High  >621     mg/dL   Very High Performed at [facility]   TSH     Status: None   Collection Time: 12/16/14  6:55 AM  Result Value Ref Range   TSH 1.607 0.350 - 4.500 uIU/mL    Comment: Performed at [facility]    Observation Level/Precautions:  15 minute checks  Laboratory:   As needed  Psychotherapy:  Supportive,  milieu  Medications:  Start Prozac 20 mgrs QAM, we discussed side effects  Consultations:  As needed   Discharge Concerns:  Psychosocial stressors   Estimated LOS: 5 days   Other:     Psychological Evaluations:no  Treatment Plan Summary: Daily contact with patient to assess and evaluate symptoms and progress in treatment, Medication management, Plan inpatient medication management and start medications as above   Medical Decision Making:  Review of Psycho-Social Stressors (1), Review or order clinical lab tests (1), New Problem, with no additional work-up planned (3) and Review of New Medication or Change in Dosage (2)  I certify that inpatient services furnished can reasonably be expected to improve the patient's condition.   Nehemiah Massed 3/17/20161:23 PM

## 2014-12-16 NOTE — Progress Notes (Signed)
[facility].  Passive SI, -HI, -A/[place], verbally contracts for safety inside hospital only. Pt presented to ED after reported overdose on 2-3 morphine pills. Pt denies suicide attempt. It is reported family stated patient was upset, tearful and talking about dying. Pt tearful, vague and reluctant to discuss stressors "I lost everything". History reviewed and patient has had an increase in depression since last year after going through a divorce (married 18years), losing a job, car and apartment, then having to move her 4 children (ages 473,8, 7619, 2420) and self in with her mother. Pt brother committed suicide by gun shot 05/15/96. Pt reports feelings of hopelessness, irritability, guilt, anxiety and crying spells. Emotional support and encouragement given. Pt admitted for evaluation, stabilization and reduction of baseline. Will monitor closely.

## 2014-12-16 NOTE — [facility] Suicide Risk Assessment (Signed)
[facility]   Nursing information obtained from:  Patient Demographic factors:  Low socioeconomic status Current Mental Status:  Self-harm thoughts Loss Factors:  Loss of significant relationship, Financial problems / change in socioeconomic status Historical Factors:  Family history of suicide, Family history of mental illness or substance abuse Risk Reduction Factors:  Responsible for children under 37 years of age, Employed, Living with another person, especially a relative Total Time spent with patient: 45 minutes Principal Problem: MDD (major depressive disorder), recurrent episode, severe Diagnosis:   Patient Active Problem List   Diagnosis Date Noted  . MDD (major depressive disorder), recurrent episode, severe [F33.2] 12/16/2014  . Suicidal thoughts [R45.851] 04/09/2014     Continued Clinical Symptoms:  Alcohol Use Disorder Identification Test Final Score (AUDIT): 1 The "Alcohol Use Disorders Identification Test", Guidelines for Use in Primary Care, Second Edition.  World Science writerHealth Organization [facility](WHO). Score between 0-7:  no or low risk or alcohol related problems. Score between 8-15:  moderate risk of alcohol related problems. Score between 16-19:  high risk of alcohol related problems. Score 20 or above:  warrants further diagnostic evaluation for alcohol dependence and treatment.   CLINICAL FACTORS:  39 year old female, severely depressed, her depression has been worsening over recent days to weeks. Impulsively overdosed on opiate analgesic she had been prescribed . Was brought to hospital by family. She has been dealing with severe stressors, to include separation/divorce. She also endorses some PTSD symptoms, particularly recollections and ruminations, related to witnessing her brother's suicide in 1997.    Musculoskeletal: Strength & Muscle Tone: within normal limits Gait & Station: normal Patient leans: N/A  Psychiatric Specialty  Exam: Physical Exam  ROS  Blood pressure 163/112, pulse 82, temperature 98.7 F (37.1 C), temperature source Oral, resp. rate 20, height 5\' 4"  (1.626 m), weight 145 lb (65.772 kg), last menstrual period 12/15/2014.Body mass index is 24.88 kg/(m^2).  SEE ADMIT NOTE MSE   COGNITIVE FEATURES THAT CONTRIBUTE TO RISK:  Closed-mindedness    SUICIDE RISK:   Moderate:  Frequent suicidal ideation with limited intensity, and duration, some specificity in terms of plans, no associated intent, good self-control, limited dysphoria/symptomatology, some risk factors present, and identifiable protective factors, including available and accessible social support.  PLAN OF CARE: Patient will be admitted to inpatient psychiatric unit for stabilization and safety. Will provide and encourage milieu participation. Provide medication management and maked adjustments as needed.  Will follow daily.    Medical Decision Making:  Review of Psycho-Social Stressors (1), Review or order clinical lab tests (1), Established Problem, Worsening (2) and Review of New Medication or Change in Dosage (2)  I certify that inpatient services furnished can reasonably be expected to improve the patient's condition.   Katherine Ward 12/16/2014, 1:55 PM
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
## 2014-12-16 NOTE — Progress Notes (Signed)
D: Patient presents with very sad, depressed affect and mood. Writer observes that the patient has been resting in bed the majority of the day and periodically came out of the room to use the hall phone. She appeared slightly, irritable when asked to come and take the Prozac medication that the doctor ordered to be administered daily. Patient reports on the self inventory sheet that she's sleeping fair at night, has poor appetite, good ability to concentrate and low energy level. Patient rated depression "2" and anxiety "3". She only attended one group session today.  A: Support and encouragement provided to patient. Scheduled medication given per MD order. Maintain Q15 minute checks for safety.  R: Patient receptive. Denies SI/HI/AVH. Patient remains safe on the hall.

## 2014-12-16 NOTE — Tx Team (Signed)
Initial Interdisciplinary Treatment Plan   PATIENT STRESSORS: Financial difficulties Marital or family conflict Substance abuse   PATIENT STRENGTHS: Ability for insight Average or above average intelligence Communication skills Supportive family/friends Work skills   PROBLEM LIST: Problem List/Patient Goals Date to be addressed Date deferred Reason deferred Estimated date of resolution  Depression "I need to find a way to deal with stress and anxiety" 12/16/14   At d/c  Substance abuse 12/16/14   At d/c  Suicidal ideation  12/16/14   At d/c                                       DISCHARGE CRITERIA:  Improved stabilization in mood, thinking, and/or behavior Motivation to continue treatment in a less acute level of care Need for constant or close observation no longer present  PRELIMINARY DISCHARGE PLAN: Participate in family therapy Return to previous living arrangement Return to previous work or school arrangements  PATIENT/FAMIILY INVOLVEMENT: This treatment plan has been presented to and reviewed with the patient, Katherine Ward.  The patient and family have been given the opportunity to ask questions and make suggestions.  [name], [name] 12/16/2014, 12:16 AM

## 2014-12-16 NOTE — [facility] Group Notes (Signed)
[facility]  [facility] 1:15 - 2:30 PM  12/16/2014    12/16/2014 3:41 PM  Type of Therapy:  Group Therapy  Participation Level:  Patient left group to meet with MD.  Wynn BankerHodnett, Sheryl Saintil Hairston 12/16/2014, 3:41 PM

## 2014-12-16 NOTE — Progress Notes (Signed)
Recreation Therapy Notes  Animal-Assisted Activity/Therapy (AAA/T) Program Checklist/Progress Notes Patient Eligibility Criteria Checklist & Daily Group note for Rec Tx Intervention  Date: 03.17.2016 Time: 2:45pm Location: [address] Dayroom    AAA/T Program Assumption of Risk Form signed by Patient/ or Parent Legal Guardian yes  Patient is free of allergies or sever asthma yes  Patient reports no fear of animals yes  Patient reports no history of cruelty to animals yes  Patient understands his/her participation is voluntary yes  Behavioral Response: Did not attend.   Mirjana Tarleton L Lavergne Hiltunen, LRT/CTRS  Leaf [employer] L 12/16/2014 5:19 PM 

## 2014-12-17 ENCOUNTER — Encounter (HOSPITAL_COMMUNITY): Payer: Self-pay | Admitting: Internal Medicine

## 2014-12-17 DIAGNOSIS — F332 Major depressive disorder, recurrent severe without psychotic features: Principal | ICD-10-CM

## 2014-12-17 DIAGNOSIS — I10 Essential (primary) hypertension: Secondary | ICD-10-CM

## 2014-12-17 LAB — HEMOGLOBIN A1C
HEMOGLOBIN A1C: 5.2 % (ref 4.8–5.6)
MEAN PLASMA GLUCOSE: 103 mg/dL

## 2014-12-17 MED ORDER — IBUPROFEN 400 MG PO TABS
400.0000 mg | ORAL_TABLET | Freq: Four times a day (QID) | ORAL | Status: DC | PRN
  Administered 2014-12-17: 400 mg via ORAL
  Filled 2014-12-17: qty 1

## 2014-12-17 MED ORDER — CITALOPRAM HYDROBROMIDE 20 MG PO TABS
20.0000 mg | ORAL_TABLET | Freq: Every day | ORAL | Status: DC
  Administered 2014-12-17 – 2014-12-18 (×2): 20 mg via ORAL
  Filled 2014-12-17 (×4): qty 1

## 2014-12-17 MED ORDER — ONDANSETRON 4 MG PO TBDP: 4.0000 mg | ORAL_TABLET | Freq: Three times a day (TID) | ORAL | Status: DC | PRN

## 2014-12-17 NOTE — Progress Notes (Signed)
[facility] Group Notes:  (Nursing/MHT/Case Management/Adjunct)  Date:  12/17/2014  Time:  11:15 PM  Type of Therapy:  Psychoeducational Skills  Participation Level:  Active  Participation Quality:  Appropriate  Affect:  Appropriate  Cognitive:  Appropriate  Insight:  Appropriate  Engagement in Group:  Engaged  Modes of Intervention:  Education  Summary of Progress/Problems: Patient expressed in group this evening that she had a good day overall since she was able to enjoy meeting with her peers today. She anticipates being discharged tomorrow. In terms of the theme for the day, her coping skill will be to return to work.   Hazle CocaGOODMAN, Beth Goodlin S 12/17/2014, 11:15 PM

## 2014-12-17 NOTE — [facility] Suicide Risk Assessment (Signed)
[facility] INPATIENT:  Family/Significant Other Suicide Prevention Education  Suicide Prevention Education:  Education Completed; Talbert CageBernice Christan, Mother, [phone];  has been identified by the patient as the family member/significant other with whom the patient will be residing, and identified as the person(s) who will aid the patient in the event of a mental health crisis (suicidal ideations/suicide attempt).  With written consent from the patient, the family member/significant other has been provided the following suicide prevention education, prior to the and/or following the discharge of the patient.  The suicide prevention education provided includes the following:  Suicide risk factors  Suicide prevention and interventions  National Suicide Hotline telephone number  [facility] assessment telephone number  [facility] Emergency Assistance 911  [facility] and/or Residential Mobile Crisis Unit telephone number  Request made of family/significant other to:  Remove weapons (e.g., guns, rifles, knives), all items previously/currently identified as safety concern.   Mother advised patient does not have access to weapons.    Remove drugs/medications (over-the-counter, prescriptions, illicit drugs), all items previously/currently identified as a safety concern.  The family member/significant other verbalizes understanding of the suicide prevention education information provided.  The family member/significant other agrees to remove the items of safety concern listed above.  Wynn BankerHodnett, Teofil Maniaci Hairston 12/17/2014, 10:29 AM

## 2014-12-17 NOTE — Tx Team (Signed)
Interdisciplinary Treatment Plan Update   Date Reviewed:  12/17/2014  Time Reviewed:  8:36 AM  Progress in Treatment:   Attending groups: Yes, patient is attending groups. Participating in groups: Yes, engages in group discussion. Taking medication as prescribed: Yes  Tolerating medication: Yes Family/Significant other contact made: Yes, collateral contact with mother Patient understands diagnosis: Yes, patient understands diagnosis and need for treatment. Discussing patient identified problems/goals with staff: Yes, patient is able to express goals for treatment and discharge. Medical problems stabilized or resolved: Yes Denies suicidal/homicidal ideation: Yes Patient has not harmed self or others: Yes  For review of initial/current patient goals, please see plan of care.  Estimated Length of Stay:  One day - Discharge Saturday  Reasons for Continued Hospitalization:  Anxiety Depression Medication stabilization   New Problems/Goals identified:    Discharge Plan or Barriers:   Home [facility]  Additional Comments:  Patient and CSW reviewed patient's identified goals and treatment plan.  Patient verbalized understanding and agreed to treatment plan.   Attendees:  Patient:  12/17/2014 8:36 AM   Signature:  Sallyanne HaversF. Cobos, MD 12/17/2014 8:36 AM  Signature: 12/17/2014 8:36 AM  Signature:  Sheryle HailKimberly Okafor, RN 12/17/2014 8:36 AM  Signature:Beverly Terrilee CroakKnight, RN 12/17/2014 8:36 AM  Signature:   12/17/2014 8:36 AM  Signature:  Juline PatchQuylle Shamia Uppal, LCSW 12/17/2014 8:36 AM  Signature:  Belenda CruiseKristin Drinkard, LCSW-A 12/17/2014 8:36 AM  Signature:   12/17/2014 8:36 AM  Signature:  12/17/2014 8:36 AM  Signature:  12/17/2014  8:36 AM  Signature:   Onnie BoerJennifer Clark, RN [facility] 12/17/2014  8:36 AM  Signature:   12/17/2014  8:36 AM    Scribe for Treatment Team:   Juline PatchQuylle Davion Flannery,  12/17/2014 8:36 AM

## 2014-12-17 NOTE — Progress Notes (Signed)
Patient ID: Katherine Ward, female   DOB: 1976-02-15, 39 y.o.   MRN: [id number]  D: Patient pleasant and cooperative with staff on assessment. No interaction with peers on unit; pt isolative to her room. Pt with dull, flat affect endorsing depression but denies SI or plans to harm herself. A: Q 15 minute safety checks, encourage staff/peer interaction and group participation. Administer medications as ordered by MD. R: Patient declined to take scheduled sleep medication. No distress noted during shift.

## 2014-12-17 NOTE — Progress Notes (Signed)
  [facility] :  Will you be returning to the same living situation after discharge:  Yes,  patient is returning home. At discharge, do you have transportation home?: Yes,  Patient is able to arrange transportation. Do you have the ability to pay for your medications: Yes,  Patient is able to obtain medications.  Release of information consent forms completed and in the chart;  Patient's signature needed at discharge.  Patient to Follow up at: Follow-up Information    [facility].   Why:  Monday, January 03, 2015 at [employer]10AM   Contact information:   [address] [place], [place]  [address]  [phone]      Follow up with Florencia Reasonseggy Bynum [facility].   Why:  Tuesday, January 04, 2015 at 2:45 PM.  You have been placed on a cancellation list for an ealier appointment if one comes available.   Contact information:   [address] [place], [place]   [id number]  [phone]      Patient denies SI/HI: Patient no longer endorsing SI/HI or other thoughts of self harm.     Safety Planning and Suicide Prevention discussed:  .Reviewed with all patients during discharge planning group   Have you used any form of tobacco in the last 30 days? (Cigarettes, Smokeless Tobacco, Cigars, and/or Pipes): Yes  Has patient been referred to the Quitline?: Patient declined referral to Quitline.   Wynn BankerHodnett, Katherine Coltrin Hairston 12/17/2014, 10:24 AM

## 2014-12-17 NOTE — Progress Notes (Signed)
D: Patient is alert and oriented. Pt's mood and affect is worried, pleasant upon interaction, and blunted. Pt denies SI/HI and AVH. Pt refused to take 0800 dose of Prozac, pt reports this medication makes her "really shaky," pt is willing to take a different medication instead. Pt experiencing HTN today and complains of headache but refuses PRN medication this morning (See docflowsheet-vitals). Pt is requesting discharge today and is worried about losing her job d/t hospital stay, pt signed 72 hour request for discharge at 0820. Pt complains of headache this evening 5/10, pt requesting ibuprofen order. A: EKG completed. MD made aware of pt's concerns, BP, and requests. Will reassess BP frequently. Active listening by RN. PRN medication administered for pain per providers orders (See MAR). Scheduled medications administered per providers orders (See MAR). 15 minute checks continued per protocol for patient safety.  R: Patient cooperative and receptive to nursing interventions. Pt remains safe.

## 2014-12-17 NOTE — Progress Notes (Signed)
[facility] MD Progress Note  12/17/2014 9:50 AM Katherine Ward  MRN:  [id number] Subjective: Patient states "I am feeling a little better today, I'm just ready to go home". She states that she is feeling better after going to group last night and having a good roommate who she was able to talk with. She states that she still is ruminating about her brother's suicide in 1997, and describes chronic PTSD type symptoms, such as intrusive recollections, flashbacks, and  "sometimes I even hear him knocking on my door at home".   Objective: I have seen the patient and discussed care with the treatment team.  At present patient is improved compared to admission. Upon admission was tearful, sad, and clearly quite depressed. Today presents with improved mood, improved range of affect. She denies any SI . She is calm, pleasant and has been interactive in milieu. She states she has forged a good relationship with her roommate , which has also been helpful. She states Prozac was not well tolerated and that after she took first dose she felt dizzy and nauseous. We discussed, and she is amenable to try another antidepressant . She is visible on day room. Responds well to support and encouragement. Of note, patient remains hypertensive. Denies headache, dizziness, visual disturbances, chest pain.  TSH and HgbA1C WNL. Lipid panel remarkable for low HDL.   Principal Problem: MDD (major depressive disorder), recurrent episode, severe Diagnosis:   Patient Active Problem List   Diagnosis Date Noted  . MDD (major depressive disorder), recurrent episode, severe [F33.2] 12/16/2014  . Suicidal thoughts [R45.851] 04/09/2014   Total Time spent with patient: 25 minutes   Past Medical History:  Past Medical History  Diagnosis Date  . Hypertension     Past Surgical History  Procedure Laterality Date  . Hernia repair    . Cesarean section     Family History: History reviewed. No pertinent family history. Social  History:  History  Alcohol Use  . Yes    Comment: occasionally     History  Drug Use  . Yes  . Special: Marijuana    History   Social History  . Marital Status: Legally Separated    Spouse Name: N/A  . Number of Children: N/A  . Years of Education: N/A   Social History Main Topics  . Smoking status: Current Every Day Smoker -- 1.00 packs/day    Types: Cigarettes  . Smokeless tobacco: Never Used  . Alcohol Use: Yes     Comment: occasionally  . Drug Use: Yes    Special: Marijuana  . Sexual Activity: Yes    Birth Control/ Protection: None   Other Topics Concern  . None   Social History Narrative   Additional History:    Sleep: Good  Appetite:  Good   Assessment:   Musculoskeletal: Strength & Muscle Tone: within normal limits Gait & Station: normal Patient leans: N/A   Psychiatric Specialty Exam: Physical Exam  Review of Systems  Constitutional: Negative for fever and chills.  Eyes: Negative.   Respiratory: Negative for shortness of breath.   Cardiovascular: Negative for chest pain.  Gastrointestinal: Negative for nausea, vomiting and blood in stool.  Skin: Negative for rash.  Neurological: Negative for headaches.  Psychiatric/Behavioral: Positive for depression. Negative for suicidal ideas and hallucinations.    Blood pressure 164/116, pulse 78, temperature 98.3 F (36.8 C), temperature source Oral, resp. rate 20, height  (1.626 m), weight 145 lb (65.772 kg), last menstrual period 12/15/2014.Body mass  index is 24.88 kg/(m^2).  General Appearance: Fairly Groomed  Patent attorneyye Contact::  Good  Speech:  Normal Rate  Volume:  Normal  Mood:  Still depressed, but feeling better, and presenting with improved range of affect   Affect:  Improving, smiling appropriately at time, no longer tearful  Thought Process:  Goal Directed and Linear  Orientation:  Full (Time, Place, and Person)  Thought Content:  No delusions, hallucinations, not internally preoccupied    Suicidal Thoughts:  No - denies any thoughts of hurting self at this time   Homicidal Thoughts:  No  Memory:  Recent and remote grossly intact  Judgement:  Other:  improved   Insight:  Present  Psychomotor Activity:  Normal, no psychomotor retardation noted .  Concentration:  Good  Recall:  Good  Fund of Knowledge:Good  Language: Good  Akathisia:  Negative  Handed:  Right  AIMS (if indicated):     Assets:  Communication Skills Desire for Improvement Housing Resilience Social Support Vocational/Educational  ADL's:  Intact  Cognition: WNL  Sleep:  Number of Hours: 6     Current Medications: Current Facility-Administered Medications  Medication Dose Route Frequency Provider Last Rate Last Dose  . alum & mag hydroxide-simeth (MAALOX/MYLANTA) 200-200-20 MG/5ML suspension 30 mL  30 mL Oral Q4H PRN Kerry HoughSpencer E Simon, PA-C      . amLODipine (NORVASC) tablet 5 mg  5 mg Oral Daily Kerry HoughSpencer E Simon, PA-C   5 mg at 12/17/14 0818  . FLUoxetine (PROZAC) capsule 20 mg  20 mg Oral Daily Craige CottaFernando A Cobos, MD   20 mg at 12/16/14 1443  . hydrOXYzine (ATARAX/VISTARIL) tablet 25 mg  25 mg Oral Q6H PRN Kerry HoughSpencer E Simon, PA-C      . lisinopril (PRINIVIL,ZESTRIL) tablet 20 mg  20 mg Oral Daily Kerry HoughSpencer E Simon, PA-C   20 mg at 12/17/14 0818  . magnesium hydroxide (MILK OF MAGNESIA) suspension 30 mL  30 mL Oral Daily PRN Kerry HoughSpencer E Simon, PA-C      . naproxen (NAPROSYN) tablet 500 mg  500 mg Oral BID PRN Kerry HoughSpencer E Simon, PA-C      . traZODone (DESYREL) tablet 50 mg  50 mg Oral QHS,MR X 1 Spencer E Simon, PA-C   50 mg at 12/16/14 0100    Lab Results:  Results for orders placed or performed during the hospital encounter of 12/15/14 (from the past 48 hour(s))  Hemoglobin A1c     Status: None   Collection Time: 12/16/14  6:55 AM  Result Value Ref Range   Hgb A1c MFr Bld 5.2 4.8 - 5.6 %    Comment: (NOTE)         Pre-diabetes: 5.7 - 6.4         Diabetes: >6.4         Glycemic control for adults with  diabetes: <7.0    Mean Plasma Glucose 103 mg/dL    Comment: (NOTE) Performed At: [facility] [address] [place], [place] [id number] Mila HomerHancock William F MD WU:[id number]:[phone] [facility]formed [facility]   Lipid panel, fasting     Status: Abnormal   Collection Time: 12/16/14  6:55 AM  Result Value Ref Range   Cholesterol 172 0 - 200 mg/dL   Triglycerides 956102 <213<150 mg/dL   HDL 24 (L) >08>39 mg/dL   Total CHOL/HDL Ratio 7.2 RATIO   VLDL 20 0 - 40 mg/dL   LDL Cholesterol 657128 (H) 0 - 99 mg/dL    Comment:  Total Cholesterol/HDL:CHD Risk Coronary Heart Disease Risk Table                     Men   Women  1/2 Average Risk   3.4   3.3  Average Risk       5.0   4.4  2 X Average Risk   9.6   7.1  3 X Average Risk  23.4   11.0        Use the calculated Patient Ratio above and the CHD Risk Table to determine the patient's CHD Risk.        ATP III CLASSIFICATION (LDL):  <100     mg/dL   Optimal  130-865  mg/dL   Near or Above                    Optimal  130-159  mg/dL   Borderline  784-696  mg/dL   High  >295     mg/dL   Very High Performed at [facility]   TSH     Status: None   Collection Time: 12/16/14  6:55 AM  Result Value Ref Range   TSH 1.607 0.350 - 4.500 uIU/mL    Comment: Performed at [facility]    Physical Findings: AIMS: Facial and Oral Movements Muscles of Facial Expression: None, normal Lips and Perioral Area: None, normal Jaw: None, normal Tongue: None, normal,Extremity Movements Upper (arms, wrists, hands, fingers): None, normal Lower (legs, knees, ankles, toes): None, normal, Trunk Movements Neck, shoulders, hips: None, normal, Overall Severity Severity of abnormal movements (highest score from questions above): None, normal Incapacitation due to abnormal movements: None, normal Patient's awareness of abnormal movements (rate only patient's report): No Awareness, Dental Status Current problems with teeth  and/or dentures?: No Does patient usually wear dentures?: No  CIWA:    COWS:      Assessment- patient improved compared to admission status , and presents less depressed, less constricted in affect, with less tearfulness. Denies SI at present. Is more engaged in milieu. Did not tolerate Prozac well but is amenable to try another antidepressant . Agrees to Celexa trial. BP elevated in spite of Lisinopril. No symptoms.  Treatment Plan Summary: Daily contact with patient to assess and evaluate symptoms and progress in treatment, Medication management, Plan continue inpatient treatment and medications as below D/C Prozac Start Celexa 20 mg QDay Will request Hospitalist Consult for assistance with HTN management.    Medical Decision Making:  Established Problem, Stable/Improving (1), Review of Psycho-Social Stressors (1), Review or order clinical lab tests (1), Review of Medication Regimen & Side Effects (2) and Review of New Medication or Change in Dosage (2)         COBOS, FERNANDO 12/17/2014, 9:50 AM

## 2014-12-17 NOTE — Consult Note (Signed)
[facility] Medical Consultation  Quentin Mullinglsie M Moulin ONG:[id number]:[id number] DOB: Feb 22, 1976 DOA: 12/15/2014 PCP: Evlyn CourierHILL,GERALD K, MD   Requesting physician: Dr. Jama Flavorsobos Date of consultation: 3.18.2016 Reason for consultation: HTN  Impression/Recommendations MDD (major depressive disorder), recurrent episode, severe - Continue further management per psychiatry.  Essential hypertension - She has had elevated blood pressure for more than 5 years, she is [facility] for major with depression is being treated with Norvasc, and hydralazine. We'll go ahead and start her on HCTZ she will need a basic metabolic panel in 2 weeks. Note that HCTZ can take up to 3 weeks to work effectively. So her blood pressure will trend down slowly. - Follow-up with her PCP in 2-4 weeks after discharge.   Chief Complaint: none HPI:  39 year old female with past medical history of hypertension was admitted to behavioral health for major depression possibly suicidal ideation. She has been asymptomatic and we are consulted for evaluation and management of her hypertension.  Review of Systems:  Constitutional:  No weight loss, night sweats, Fevers, chills, fatigue.  HEENT:  No headaches, Difficulty swallowing,Tooth/dental problems,Sore throat,  No sneezing, itching, ear ache, nasal congestion, post nasal drip,  Cardio-vascular:  No chest pain, Orthopnea, PND, swelling in lower extremities, anasarca, dizziness, palpitations  GI:  No heartburn, indigestion, abdominal pain, nausea, vomiting, diarrhea, change in bowel habits, loss of appetite  Resp:  No shortness of breath with exertion or at rest. No excess mucus, no productive cough, No non-productive cough, No coughing up of blood.No change in color of mucus.No wheezing.No chest wall deformity  Skin:  no rash or lesions.  GU:  no dysuria, change in color of urine, no urgency or frequency. No flank pain.  Musculoskeletal:  No joint pain or swelling. No  decreased range of motion. No back pain.  Psych:  No change in mood or affect. No depression or anxiety. No memory loss.    Past Medical History  Diagnosis Date  . Hypertension    Past Surgical History  Procedure Laterality Date  . Hernia repair    . Cesarean section     Social History:  reports that she has been smoking Cigarettes.  She has been smoking about 1.00 pack per day. She has never used smokeless tobacco. She reports that she drinks alcohol. She reports that she uses illicit drugs (Marijuana).  Allergies  Allergen Reactions  . Acetaminophen     Gives patient a very high fever   Family History  Problem Relation Age of Onset  . Diabetes Mellitus II Mother   . Hypertension Mother   . Hypertension Father     Prior to Admission medications   Medication Sig Start Date End Date Taking? Authorizing Provider  Aspirin-Salicylamide-Caffeine (BC HEADACHE POWDER PO) Take 1 Package by mouth daily as needed (headache).   Yes Historical Provider, MD  lisinopril (PRINIVIL,ZESTRIL) 20 MG tablet Take 20 mg by mouth daily.     Yes Historical Provider, MD  hydrocodone-ibuprofen (VICOPROFEN) 5-200 MG per tablet Take 1 tablet by mouth every 8 (eight) hours as needed for pain. Patient not taking: Reported on 12/15/2014 10/14/14   Janne NapoleonHope M Neese, NP  metroNIDAZOLE (FLAGYL) 500 MG tablet Take 1 tablet (500 mg total) by mouth 2 (two) times daily. Patient not taking: Reported on 12/15/2014 11/26/14   Layla MawKristen N Ward, DO  oxyCODONE (ROXICODONE) 5 MG immediate release tablet Take 1 tablet (5 mg total) by mouth every 6 (six) hours as needed for severe pain. Patient not taking:  Reported on 12/15/2014 11/26/14   Layla Maw Ward, DO  promethazine (PHENERGAN) 25 MG tablet Take 1 tablet (25 mg total) by mouth every 6 (six) hours as needed for nausea or vomiting. Patient not taking: Reported on 12/15/2014 10/14/14   Janne Napoleon, NP   Physical Exam: Blood pressure 158/104, pulse 65, temperature 98.3 F (36.8  C), temperature source Oral, resp. rate 20, height  (1.626 m), weight 65.772 kg (145 lb), last menstrual period 12/15/2014. Filed Vitals:   12/17/14 1206  BP: 158/104  Pulse: 65  Temp:   Resp:      General:  She is awake alert and oriented 3  Eyes: Anicteric no pallor  ENT: Trachea is midline  Neck: No JVD  Cardiovascular: Regular rate and rhythm  Respiratory: Good air movement clear to auscultation  Abdomen: Bowel sounds nontender nondistended soft  Skin: No rashes or edema  Musculoskeletal: Intact  Psychiatric: Appropriate  Neurologic: Nonfocal.  Labs on Admission:  Basic Metabolic Panel:  Recent Labs Lab 12/15/14 2031  NA 139  K 3.8  CL 108  CO2 24  GLUCOSE 96  BUN 8  CREATININE 0.96  CALCIUM 9.1   Liver Function Tests:  Recent Labs Lab 12/15/14 2031  AST 19  ALT 11  ALKPHOS 46  BILITOT 0.7  PROT 7.0  ALBUMIN 3.9   No results for input(s): LIPASE, AMYLASE in the last 168 hours. No results for input(s): AMMONIA in the last 168 hours. CBC:  Recent Labs Lab 12/15/14 2031  WBC 5.8  NEUTROABS 3.2  HGB 12.7  HCT 39.0  MCV 76.2*  PLT 224   Cardiac Enzymes: No results for input(s): CKTOTAL, CKMB, CKMBINDEX, TROPONINI in the last 168 hours. BNP: Invalid input(s): POCBNP CBG: No results for input(s): GLUCAP in the last 168 hours.  Radiological Exams on Admission: No results found.  EKG: Independently reviewed. none  Time spent: 80 min  Marinda Elk [facility] Pager [phone]  If 7PM-7AM, please contact night-coverage [email or web address] Password [email or web address] 12/17/2014, 1:52 PM

## 2014-12-17 NOTE — Progress Notes (Signed)
Recreation Therapy Notes  Date: 03.18.2016 Time: 9:30am Location: [address] Group Room   Group Topic: Stress Management  Goal Area(s) Addresses:  Patient will actively participate in stress management techniques presented during session.   Behavioral Response: Did not attend.    Janila Arrazola L Marnita Poirier, LRT/CTRS  Raphaela Cannaday L 12/17/2014 4:37 PM 

## 2014-12-17 NOTE — [facility] Group Notes (Signed)
[facility] LCSW Group Therapy  Feelings Around Relapse 1:15 -2:30        12/17/2014 3:45 PM   Type of Therapy:  Group Therapy  Participation Level:  Appropriate  Participation Quality:  Appropriate  Affect:  Appropriate  Cognitive:  Attentive Appropriate  Insight:  Developing/Improving  Engagement in Therapy: Developing/Improving  Modes of Intervention:  Discussion Exploration Problem-Solving Supportive  Summary of Progress/Problems:  The topic for today was feelings around relapse.    Patient did not participate in the discussion.  Wynn BankerHodnett, Ladaysha Soutar Hairston 12/17/2014 3:45 PM

## 2014-12-17 NOTE — [facility] Group Notes (Signed)
[facility]   12/17/2014 10:08 AM    Participation Quality:  Appropraite  Mood/Affect:  Irritable  Depression Rating:  0  Anxiety Rating:  0  Thoughts of Suicide:  No  Will you contract for safety?   NA  Current AVH:  No  Plan for Discharge/Comments:  Patient attended discharge planning group and actively participated in group.  Patient is requesting to discharge today.  She stated she fears losing her job if she remains in the hospital until Monday.  Patient stated she did not want to hurt anyone but she wants to discharge.  She signed a 72 hour request for discharge this morning.  [facility]   Suicide prevention education reviewed and SPE document provided.   Transportation Means: Patient has transportation.   Supports:  Patient has a support system.   Algie Cales, Joesph JulyQuylle Hairston

## 2014-12-18 ENCOUNTER — Encounter (HOSPITAL_COMMUNITY): Payer: Self-pay | Admitting: Registered Nurse

## 2014-12-18 LAB — BASIC METABOLIC PANEL
Anion gap: 11 (ref 5–15)
BUN: 11 mg/dL (ref 6–23)
CO2: 23 mmol/L (ref 19–32)
Calcium: 9.4 mg/dL (ref 8.4–10.5)
Chloride: 106 mmol/L (ref 96–112)
Creatinine, Ser: 0.9 mg/dL (ref 0.50–1.10)
GFR calc non Af Amer: 81 mL/min — ABNORMAL LOW (ref >90)
Glucose, Bld: 87 mg/dL (ref 70–99)
POTASSIUM: 3.8 mmol/L (ref 3.5–5.1)
Sodium: 140 mmol/L (ref 135–145)

## 2014-12-18 MED ORDER — AMLODIPINE BESYLATE 5 MG PO TABS: 5.0000 mg | ORAL_TABLET | Freq: Every day | ORAL | Status: DC

## 2014-12-18 MED ORDER — TRAZODONE HCL 50 MG PO TABS: 50.0000 mg | ORAL_TABLET | Freq: Every evening | ORAL | Status: DC | PRN

## 2014-12-18 MED ORDER — HYDROXYZINE HCL 25 MG PO TABS: 25.0000 mg | ORAL_TABLET | Freq: Four times a day (QID) | ORAL | Status: DC | PRN

## 2014-12-18 MED ORDER — LISINOPRIL 20 MG PO TABS: 20.0000 mg | ORAL_TABLET | Freq: Every day | ORAL | Status: DC

## 2014-12-18 MED ORDER — CITALOPRAM HYDROBROMIDE 20 MG PO TABS: 20.0000 mg | ORAL_TABLET | Freq: Every day | ORAL | Status: DC

## 2014-12-18 NOTE — Progress Notes (Signed)
Patient ID: Katherine Ward, female   DOB: Jun 03, 1976, 39 y.o.   MRN: [id number]  Adult Psychoeducational Group Note  Date:  12/18/2014 Time:  09:30  Group Topic/Focus:   Orientation:   The focus of this group is to educate the patient on the purpose and policies of crisis stabilization and provide a format to answer questions about their admission.  The group details unit policies and expectations of patients while admitted. Wellness Toolbox:   The focus of this group is to discuss various aspects of wellness, balancing those aspects and exploring ways to increase the ability to experience wellness.  Patients will create a wellness toolbox for use upon discharge.  Participation Level:  Active  Participation Quality:  Appropriate  Affect:  Flat  Cognitive:  Appropriate  Insight: Limited  Engagement in Group:  Engaged  Modes of Intervention:  Discussion, Education, Orientation and Support  Additional Comments:  Pt a positive part of group today. Pt able to identify one goal to achieve today.   Aurora Maskwyman, Radley Teston E 12/18/2014, 9:51 AM

## 2014-12-18 NOTE — Progress Notes (Signed)
Patient ID: Katherine Ward, female   DOB: 07-Dec-1975, 39 y.o.   MRN: [id number]  D: Pt with dull, flat affect endorsing depression. Pt denies SI or plans to harm herself at this time. No s/s of distress noted. Pt states she is ready to be discharged early in the am so she can return to work as scheduled.  A: Q 15 minute safety checks, encourage staff/peer interaction and group participation. Administer medications as ordered by MD. R: No needs during shift.

## 2014-12-18 NOTE — [facility] Suicide Risk Assessment (Addendum)
[facility] Discharge Suicide Risk Assessment   Demographic Factors:  Low socioeconomic status  Total Time spent with patient: 30 minutes  Musculoskeletal: Strength & Muscle Tone: within normal limits Gait & Station: normal Patient leans: N/A  Psychiatric Specialty Exam: Physical Exam  Review of Systems  Constitutional: Negative for fever, chills and weight loss.  HENT: Negative for ear pain and sore throat.   Eyes: Negative for blurred vision and pain.  Respiratory: Negative for cough, sputum production and shortness of breath.   Cardiovascular: Negative for chest pain and palpitations.  Gastrointestinal: Negative for heartburn and abdominal pain.  Musculoskeletal: Negative for back pain, joint pain and neck pain.  Skin: Negative for itching and rash.  Neurological: Negative for dizziness, seizures and headaches.  Psychiatric/Behavioral: Negative for depression, suicidal ideas, hallucinations and substance abuse. The patient is not nervous/anxious and does not have insomnia.     Blood pressure 152/113, pulse 90, temperature 98.3 F (36.8 C), temperature source Oral, resp. rate 18, height  (1.626 m), weight 65.772 kg (145 lb), last menstrual period 12/15/2014.Body mass index is 24.88 kg/(m^2).  General Appearance: Casual  Eye Contact::  Good  Speech:  Clear and Coherent and Normal Rate  Volume:  Normal  Mood:  Euthymic  Affect:  Depressed and mild  Thought Process:  Goal Directed, Linear and Logical  Orientation:  Full (Time, Place, and Person)  Thought Content:  Negative  Suicidal Thoughts:  No  Homicidal Thoughts:  No  Memory:  Immediate;   Good Recent;   Good Remote;   Good  Judgement:  Good  Insight:  Good  Psychomotor Activity:  Normal  Concentration:  Good  Recall:  Good  Fund of Knowledge:Good  Language: Good  Akathisia:  No  Handed:  Right  AIMS (if indicated):     Assets:  Communication Skills Desire for Improvement Housing Social Support  Sleep:  Number  of Hours: 5.75  Cognition: WNL  ADL's:  Intact   Have you used any form of tobacco in the last 30 days? (Cigarettes, Smokeless Tobacco, Cigars, and/or Pipes): Yes  Has this patient used any form of tobacco in the last 30 days? (Cigarettes, Smokeless Tobacco, Cigars, and/or Pipes) Yes, Prescription not provided because: pt is ready to quit and does not want meds. Has quit in the past on her own  Mental Status Per Nursing Assessment::   On Admission:  Self-harm thoughts  Current Mental Status by Physician: NA  Loss Factors: Loss of significant relationship and Financial problems/change in socioeconomic status  Historical Factors: Family history of suicide and Family history of mental illness or substance abuse  Risk Reduction Factors:   Responsible for children under 43 years of age, Employed and Living with another person, especially a relative  Continued Clinical Symptoms:  n/a  Cognitive Features That Contribute To Risk:  None    Suicide Risk:  Minimal: No identifiable suicidal ideation.  Patients presenting with no risk factors but with morbid ruminations; may be classified as minimal risk based on the severity of the depressive symptoms  Principal Problem: MDD (major depressive disorder), recurrent episode, severe Discharge Diagnoses:  Patient Active Problem List   Diagnosis Date Noted  . Essential hypertension [I10] 12/17/2014  . MDD (major depressive disorder), recurrent episode, severe [F33.2] 12/16/2014  . Suicidal thoughts [R45.851] 04/09/2014    Follow-up Information    Follow up with Dr. Tenny Craw - [facility] On 01/03/2015.   Why:  Monday, January 03, 2015 at [employer]  information:   [address] [place], [place]  [id number]  [phone]      Follow up with Florencia Reasonseggy Bynum - [facility] On 01/04/2015.   Why:  Tuesday, January 04, 2015 at 2:45 PM.  You have been placed on a cancellation list for an ealier appointment if one comes available.    Contact information:   [address] [place], [place]   [id number]  [phone]      Plan Of Care/Follow-up recommendations: Pt states she is ready for d/c today. Denies depression, hopelessness, anhedonia, SI/HI, AVH and substance abuse. She is taking meds as prescribed and denies SE. Pt will d/c home to her 4 children. 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 She was advised to get rid of all excess meds due to recent suicide attempt by OD. Also today given scripts with only enough pills to take till her scheduled psychiatric outpatient f/up appt.  Activity:  as tolerated Diet:  resume normal diet Other:  f/up with psychiatrist and take meds as prescribed  Is patient on multiple antipsychotic therapies at discharge:  No   Has Patient had three or more failed trials of antipsychotic monotherapy by history:  No  Recommended Plan for Multiple Antipsychotic Therapies: NA    Dineen Conradt 12/18/2014, 9:06 AM

## 2014-12-18 NOTE — Discharge Summary (Signed)
Physician Discharge Summary Note  Patient:  Katherine Ward is an 39 y.o., female MRN:  [id number] DOB:  04-Jun-1976 Patient phone:  [phone] (home)  Patient address:   [address] [place] [address],  Total Time spent with patient: Greater than 30 minutes  Date of Admission:  12/15/2014 Date of Discharge: 12/18/2014  Reason for Admission:  Per H&P admission:  39 year old female, was taken to hospital by daughter and friend, because she was becoming more depressed and yesterday had overdosed on morphine ( which had been prescribed for her in the past but she had not been taking) . States " I don't know what I was doing, just wanted to ease the pain". States " I probably passed out" and has little recollection of events until family brought her to hospital. She states she has been depressed for a long time ( " years"), but it has been worsening. She reports symptoms of depression as below, and also has been progressively more socially isolated .  Principal Problem: MDD (major depressive disorder), recurrent episode, severe Discharge Diagnoses: Patient Active Problem List   Diagnosis Date Noted  . Essential hypertension [I10] 12/17/2014  . MDD (major depressive disorder), recurrent episode, severe [F33.2] 12/16/2014  . Suicidal thoughts [R45.851] 04/09/2014    Musculoskeletal: Strength & Muscle Tone: within normal limits Gait & Station: normal Patient leans: N/A  Psychiatric Specialty Exam:  See Suicide Risk Assessment Physical Exam  Constitutional: She is oriented to person, place, and time.  Neck: Normal range of motion.  Respiratory: Effort normal.  Musculoskeletal: Normal range of motion.  Neurological: She is alert and oriented to person, place, and time.    Review of Systems  Psychiatric/Behavioral: Positive for substance abuse. Negative for suicidal ideas, hallucinations and memory loss. Depression: Stable. Nervous/anxious: Stable. Insomnia: Stable.   All other systems  reviewed and are negative.   Blood pressure 154/105, pulse 78, temperature 98.6 F (37 C), temperature source Oral, resp. rate 16, height 5' 4"  (1.626 m), weight 65.772 kg (145 lb), last menstrual period 12/15/2014.Body mass index is 24.88 kg/(m^2).    Past Medical History:  Past Medical History  Diagnosis Date  . Hypertension     Past Surgical History  Procedure Laterality Date  . Hernia repair    . Cesarean section     Family History:  Family History  Problem Relation Age of Onset  . Diabetes Mellitus II Mother   . Hypertension Mother   . Hypertension Father    Social History:  History  Alcohol Use  . Yes    Comment: occasionally     History  Drug Use  . Yes  . Special: Marijuana    History   Social History  . Marital Status: Legally Separated    Spouse Name: N/A  . Number of Children: N/A  . Years of Education: N/A   Social History Main Topics  . Smoking status: Current Every Day Smoker -- 1.00 packs/day    Types: Cigarettes  . Smokeless tobacco: Never Used  . Alcohol Use: Yes     Comment: occasionally  . Drug Use: Yes    Special: Marijuana  . Sexual Activity: Yes    Birth Control/ Protection: None   Other Topics Concern  . None   Social History Narrative    Risk to Self: Is patient at risk for suicide?: Yes Risk to Others:   Prior Inpatient Therapy:   Prior Outpatient Therapy:    Level of Care:  OP  Hospital Course:  KOREN PLYLER was admitted for MDD (major depressive disorder), recurrent episode, severe and crisis management.  She was treated discharged with the medications listed below under Medication List.  Medical problems were identified and treated as needed.  Home medications were restarted as appropriate.  Improvement was monitored by observation and Verl Dicker Sigala daily report of symptom reduction.  Emotional and mental status was monitored by daily self-inventory reports completed by Shana Chute and clinical staff.         Meron Bocchino  [place] was evaluated by the treatment team for stability and plans for continued recovery upon discharge.  [place] Warriner Lofgren motivation was an integral factor for scheduling further treatment.  Employment, transportation, bed availability, health status, family support, and any pending legal issues were also considered during her hospital stay.  She was offered further treatment options upon discharge including but not limited to Residential, Intensive Outpatient, and Outpatient treatment.  Raeven Pint Espejo will follow up with the services as listed below under Follow Up Information.     Upon completion of this admission the patient was both mentally and medically stable for discharge denying suicidal/homicidal ideation, auditory/visual/tactile hallucinations, delusional thoughts and paranoia.      Consults:  psychiatry  Significant Diagnostic Studies:  labs: BMP, HgbA1c, Lipid panel, TSH, UDS, CBC/Diff, CMET, ETOH,   Discharge Vitals:   Blood pressure 154/105, pulse 78, temperature 98.6 F (37 C), temperature source Oral, resp. rate 16, height 5' 4"  (1.626 m), weight 65.772 kg (145 lb), last menstrual period 12/15/2014. Body mass index is 24.88 kg/(m^2). Lab Results:   Results for orders placed or performed during the hospital encounter of 12/15/14 (from the past 72 hour(s))  Hemoglobin A1c     Status: None   Collection Time: 12/16/14  6:55 AM  Result Value Ref Range   Hgb A1c MFr Bld 5.2 4.8 - 5.6 %    Comment: (NOTE)         Pre-diabetes: 5.7 - 6.4         Diabetes: >6.4         Glycemic control for adults with diabetes: <7.0    Mean Plasma Glucose 103 mg/dL    Comment: (NOTE) Performed At: [facility] [place], [place] [id number] Lindon Romp MD NO:[id number] Performed at [facility]   Lipid panel, fasting     Status: Abnormal   Collection Time: 12/16/14  6:55 AM  Result Value Ref Range   Cholesterol 172 0 - 200 mg/dL   Triglycerides 102 <150  mg/dL   HDL 24 (L) >39 mg/dL   Total CHOL/HDL Ratio 7.2 RATIO   VLDL 20 0 - 40 mg/dL   LDL Cholesterol 128 (H) 0 - 99 mg/dL    Comment:        Total Cholesterol/HDL:CHD Risk Coronary Heart Disease Risk Table                     Men   Women  1/2 Average Risk   3.4   3.3  Average Risk       5.0   4.4  2 X Average Risk   9.6   7.1  3 X Average Risk  23.4   11.0        Use the calculated Patient Ratio above and the CHD Risk Table to determine the patient's CHD Risk.        ATP III CLASSIFICATION (LDL):  <100  mg/dL   Optimal  100-129  mg/dL   Near or Above                    Optimal  130-159  mg/dL   Borderline  160-189  mg/dL   High  >190     mg/dL   Very High Performed at [facility]   TSH     Status: None   Collection Time: 12/16/14  6:55 AM  Result Value Ref Range   TSH 1.607 0.350 - 4.500 uIU/mL    Comment: Performed at [facility] metabolic panel     Status: Abnormal   Collection Time: 12/18/14  6:44 AM  Result Value Ref Range   Sodium 140 135 - 145 mmol/L   Potassium 3.8 3.5 - 5.1 mmol/L   Chloride 106 96 - 112 mmol/L   CO2 23 19 - 32 mmol/L   Glucose, Bld 87 70 - 99 mg/dL   BUN 11 6 - 23 mg/dL   Creatinine, Ser 0.90 0.50 - 1.10 mg/dL   Calcium 9.4 8.4 - 10.5 mg/dL   GFR calc non Af Amer 81 (L) >90 mL/min   GFR calc Af Amer >90 >90 mL/min    Comment: (NOTE) The eGFR has been calculated using the CKD EPI equation. This calculation has not been validated in all clinical situations. eGFR's persistently <90 mL/min signify possible Chronic Kidney Disease.    Anion gap 11 5 - 15    Comment: Performed at [facility]    Physical Findings: AIMS: Facial and Oral Movements Muscles of Facial Expression: None, normal Lips and Perioral Area: None, normal Jaw: None, normal Tongue: None, normal,Extremity Movements Upper (arms, wrists, hands, fingers): None, normal Lower (legs, knees, ankles, toes): None, normal,  Trunk Movements Neck, shoulders, hips: None, normal, Overall Severity Severity of abnormal movements (highest score from questions above): None, normal Incapacitation due to abnormal movements: None, normal Patient's awareness of abnormal movements (rate only patient's report): No Awareness, Dental Status Current problems with teeth and/or dentures?: No Does patient usually wear dentures?: No  CIWA:    COWS:      See Psychiatric Specialty Exam and Suicide Risk Assessment completed by Attending Physician prior to discharge.  Discharge destination:  Home  Is patient on multiple antipsychotic therapies at discharge:  No   Has Patient had three or more failed trials of antipsychotic monotherapy by history:  No    Recommended Plan for Multiple Antipsychotic Therapies: NA      Discharge Instructions    Activity as tolerated - No restrictions    Complete by:  As directed      Diet - low sodium heart healthy    Complete by:  As directed      Discharge instructions    Complete by:  As directed   Take all of you medications as prescribed by your mental healthcare provider.  Report any adverse effects and reactions from your medications to your outpatient provider promptly. Do not engage in alcohol and or illegal drug use while on prescription medicines. In the event of worsening symptoms call the crisis hotline, 911, and or go to the nearest emergency department for appropriate evaluation and treatment of symptoms. Follow-up with your primary care provider for your medical issues, concerns and or health care needs.   Keep all scheduled appointments.  If you are unable to keep an appointment call to reschedule.  Let the nurse know if you will  need medications before next scheduled appointment.            Medication List    STOP taking these medications        BC HEADACHE POWDER PO     hydrocodone-ibuprofen 5-200 MG per tablet  Commonly known as:  VICOPROFEN     metroNIDAZOLE  500 MG tablet  Commonly known as:  FLAGYL     oxyCODONE 5 MG immediate release tablet  Commonly known as:  ROXICODONE     promethazine 25 MG tablet  Commonly known as:  PHENERGAN      TAKE these medications      Indication   amLODipine 5 MG tablet  Commonly known as:  NORVASC  Take 1 tablet (5 mg total) by mouth daily. For high blood pressure   Indication:  High Blood Pressure     citalopram 20 MG tablet  Commonly known as:  CELEXA  Take 1 tablet (20 mg total) by mouth daily. For depression   Indication:  Depression     hydrOXYzine 25 MG tablet  Commonly known as:  ATARAX/VISTARIL  Take 1 tablet (25 mg total) by mouth every 6 (six) hours as needed for anxiety.   Indication:  anxiety     lisinopril 20 MG tablet  Commonly known as:  PRINIVIL,ZESTRIL  Take 1 tablet (20 mg total) by mouth daily. For high blood pressure   Indication:  High Blood Pressure     traZODone 50 MG tablet  Commonly known as:  DESYREL  Take 1 tablet (50 mg total) by mouth at bedtime and may repeat dose one time if needed. For sleep   Indication:  Trouble Sleeping       Follow-up Information    Follow up with Dr. Harrington Challenger - [facility] On 01/03/2015.   Why:  Monday, January 03, 2015 at [employer] information:   [address] [place], [place]  [address]  [phone]      Follow up with [facility] On 01/04/2015.   Why:  Tuesday, January 04, 2015 at 2:45 PM.  You have been placed on a cancellation list for an ealier appointment if one comes available.   Contact information:   [address], [place]   [address]  [phone]      Follow up with Maggie Font, MD.   Specialty:  Family Medicine   Why:  For high blood pressure and medication management   Contact information:   [address] [place] [address] [phone]       Follow-up recommendations:  Activity:  As tolerated Diet:  Low soduim  Comments:   Patient has been instructed to take  medications as prescribed; and report adverse effects to outpatient provider.  Follow up with primary doctor for any medical issues and If symptoms recur report to nearest emergency or crisis hot line.    Total Discharge Time: Greater than 30 minutes  Signed: Naydelin Ziegler, FNP-BC 12/18/2014, 10:20 AM

## 2014-12-18 NOTE — Progress Notes (Signed)
D:Pt has poor eye contact with a flat affect. Pt reports that she talked yesterday in group and that helped her to start working on her depression. Pt says that she takes care of others including her children and a handicap child of her brothers. Pt's brother committed suicide.  Pt reports that she apologized to her mother for her OD since her mother has already buried one child. She reports that she will go to her counselor and followup appointment. Pt's blood pressure is elevated this morning.  A:Rechecked blood pressure Reported blood pressure to NP. Offered support and 15 minute checks. Encouraged pt to develop a safety plan. Requested NP to re evaluate due to high risk, brother suicide and recent separation with her husband. Pt has access to medication as works as a Scientist, clinical (histocompatibility and immunogenetics)med tech.  R:Pt denies si and hi. Safety maintained on the unit.

## 2014-12-18 NOTE — Progress Notes (Signed)
Pt d/c from the hospital.  All items returned. D/C instructions given. Pt denies si and hi.  

## 2014-12-22 NOTE — Progress Notes (Signed)
Patient Discharge Instructions:  Next Level Care Provider Has Access to the EMR, 12/22/14  Records provided to [facility] via [facility]/Epic access.  Jerelene ReddenSheena E [place], 12/22/2014, 2:53 PM

## 2015-01-03 ENCOUNTER — Ambulatory Visit (HOSPITAL_COMMUNITY): Payer: Self-pay | Admitting: Psychiatry

## 2015-01-04 ENCOUNTER — Ambulatory Visit (INDEPENDENT_AMBULATORY_CARE_PROVIDER_SITE_OTHER): Payer: Managed Care, Other (non HMO) | Admitting: Psychiatry

## 2015-01-04 ENCOUNTER — Encounter (HOSPITAL_COMMUNITY): Payer: Self-pay | Admitting: Psychiatry

## 2015-01-04 DIAGNOSIS — F332 Major depressive disorder, recurrent severe without psychotic features: Secondary | ICD-10-CM
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
## 2015-01-04 NOTE — Patient Instructions (Signed)
Discussed orally 

## 2015-01-21 ENCOUNTER — Ambulatory Visit (HOSPITAL_COMMUNITY): Payer: Self-pay | Admitting: Psychiatry

## 2015-01-24 ENCOUNTER — Ambulatory Visit (HOSPITAL_COMMUNITY): Payer: Self-pay | Admitting: Psychiatry

## 2015-01-28 ENCOUNTER — Ambulatory Visit (HOSPITAL_COMMUNITY): Payer: Self-pay | Admitting: Psychiatry

## 2015-02-07 ENCOUNTER — Ambulatory Visit (HOSPITAL_COMMUNITY): Payer: Self-pay | Admitting: Psychiatry

## 2015-07-04 ENCOUNTER — Emergency Department (HOSPITAL_COMMUNITY): Payer: Managed Care, Other (non HMO)

## 2015-07-04 ENCOUNTER — Emergency Department (HOSPITAL_COMMUNITY)
Admission: EM | Admit: 2015-07-04 | Discharge: 2015-07-04 | Disposition: A | Discharge status: Home / Self Care | Payer: Managed Care, Other (non HMO) | Attending: Emergency Medicine | Admitting: Emergency Medicine

## 2015-07-04 ENCOUNTER — Encounter (HOSPITAL_COMMUNITY): Payer: Self-pay | Admitting: *Deleted

## 2015-07-04 DIAGNOSIS — Y9389 Activity, other specified: Secondary | ICD-10-CM | POA: Insufficient documentation

## 2015-07-04 DIAGNOSIS — Y998 Other external cause status: Secondary | ICD-10-CM | POA: Diagnosis not present

## 2015-07-04 DIAGNOSIS — I10 Essential (primary) hypertension: Secondary | ICD-10-CM | POA: Diagnosis not present

## 2015-07-04 DIAGNOSIS — Y9289 Other specified places as the place of occurrence of the external cause: Secondary | ICD-10-CM | POA: Diagnosis not present

## 2015-07-04 DIAGNOSIS — W19XXXA Unspecified fall, initial encounter: Secondary | ICD-10-CM

## 2015-07-04 DIAGNOSIS — M25532 Pain in left wrist: Secondary | ICD-10-CM | POA: Diagnosis not present

## 2015-07-04 DIAGNOSIS — Z720 Tobacco use: Secondary | ICD-10-CM | POA: Diagnosis not present

## 2015-07-04 DIAGNOSIS — S8992XA Unspecified injury of left lower leg, initial encounter: Secondary | ICD-10-CM | POA: Diagnosis not present

## 2015-07-04 DIAGNOSIS — Z79899 Other long term (current) drug therapy: Secondary | ICD-10-CM | POA: Diagnosis not present

## 2015-07-04 DIAGNOSIS — W1839XA Other fall on same level, initial encounter: Secondary | ICD-10-CM | POA: Insufficient documentation

## 2015-07-04 DIAGNOSIS — M25562 Pain in left knee: Secondary | ICD-10-CM

## 2015-07-04 MED ORDER — NAPROXEN 500 MG PO TABS: 500.0000 mg | ORAL_TABLET | Freq: Two times a day (BID) | ORAL | Status: DC

## 2015-07-04 MED ORDER — IBUPROFEN 800 MG PO TABS
800.0000 mg | ORAL_TABLET | Freq: Once | ORAL | Status: AC
  Administered 2015-07-04: 800 mg via ORAL
  Filled 2015-07-04: qty 1

## 2015-07-04 NOTE — Discharge Instructions (Signed)

## 2015-07-04 NOTE — ED Notes (Signed)
Pt c/o left leg and left wrist pain after a fall x 2 days ago

## 2015-07-04 NOTE — ED Provider Notes (Signed)
CSN: [id number]     Arrival date & time 07/04/15  7846 History   First MD Initiated Contact with Patient 07/04/15 [phone]     Chief Complaint  Patient presents with  . 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 She also has left knee pain after a fall 2 days ago. Did not hit her head or lose consciousness. Fell directly landing on left knee. Has pain with movement and weightbearing. Denies any focal weakness, numbness or tingling. Denies hitting her head or losing consciousness. No neck or back pain. No chest or abdominal pain. No numbness or tingling in her hand.  The history is provided by the patient.    Past Medical History  Diagnosis Date  . Hypertension    Past Surgical History  Procedure Laterality Date  . Hernia repair    . Cesarean section     Family History  Problem Relation Age of Onset  . Hypertension Mother   . Hypertension Father   . Bipolar disorder Paternal Aunt    Social History  Substance Use Topics  . Smoking status: Current Every Day Smoker -- 0.25 packs/day for 12 years    Types: Cigarettes  . Smokeless tobacco: Never Used  . Alcohol Use: Yes     Comment: occasionally - last used marijuana 2 months ago, beer or wine cooler about 1 x per month   OB History    No data available     Review of Systems  Constitutional: Negative for fever, activity change and appetite change.  HENT: Negative for congestion and rhinorrhea.   Eyes: Negative for photophobia.  Respiratory: Negative for cough, chest tightness and shortness of breath.   Cardiovascular: Negative for chest pain.  Gastrointestinal: Negative for nausea, vomiting and abdominal pain.  Genitourinary: Negative for dysuria and hematuria.  Musculoskeletal: Positive for myalgias and arthralgias. Negative for back  pain and neck pain.  Skin: Negative for rash.  Neurological: Negative for dizziness, weakness, light-headedness and headaches.  A complete 10 system review of systems was obtained and all systems are negative except as noted in the HPI and PMH.      Allergies  Acetaminophen  Home Medications   Prior to Admission medications   Medication Sig Start Date End Date Taking? Authorizing Provider  amLODipine (NORVASC) 5 MG tablet Take 1 tablet (5 mg total) by mouth daily. For high blood pressure 12/18/14   Shuvon B Rankin, NP  citalopram (CELEXA) 20 MG tablet Take 1 tablet (20 mg total) by mouth daily. For depression 12/18/14   Shuvon B Rankin, NP  hydrOXYzine (ATARAX/VISTARIL) 25 MG tablet Take 1 tablet (25 mg total) by mouth every 6 (six) hours as needed for anxiety. 12/18/14   Shuvon B Rankin, NP  lisinopril (PRINIVIL,ZESTRIL) 20 MG tablet Take 1 tablet (20 mg total) by mouth daily. For high blood pressure 12/18/14   Shuvon B Rankin, NP  naproxen (NAPROSYN) 500 MG tablet Take 1 tablet (500 mg total) by mouth 2 (two) times daily. 07/04/15   Glynn Octave, MD  traZODone (DESYREL) 50 MG tablet Take 1 tablet (50 mg total) by mouth at bedtime and may repeat dose one time if needed. For sleep 12/18/14   Shuvon B Rankin, NP   BP 153/85 mmHg  Pulse 70  Temp(Src) 97.7 F (36.5 C) (Oral)  Resp 20  Ht  (  1.626 m)  Wt 159 lb (72.122 kg)  BMI 27.28 kg/m2  SpO2 100%  LMP 06/06/2015 Physical Exam  Constitutional: She is oriented to person, place, and time. She appears well-developed and well-nourished. No distress.  HENT:  Head: Normocephalic and atraumatic.  Mouth/Throat: Oropharynx is clear and moist. No oropharyngeal exudate.  Eyes: Conjunctivae and EOM are normal. Pupils are equal, round, and reactive to light.  Neck: Normal range of motion. Neck supple.  No meningismus.  Cardiovascular: Normal rate, regular rhythm, normal heart sounds and intact distal pulses.   No murmur  heard. Pulmonary/Chest: Effort normal and breath sounds normal. No respiratory distress.  Abdominal: Soft. There is no tenderness. There is no rebound and no guarding.  Musculoskeletal: Normal range of motion. She exhibits tenderness. She exhibits no edema.  Tenderness to L lateral dorsal wrist. No effusion or erythema. Full range of motion. Intact radial pulse. Cardinal hand movements intact.  Pain to palpation of left anterior patella. Flexion and extension intact. No ligament laxity. Intact distal pulses.  No T or L-spine tenderness.  Neurological: She is alert and oriented to person, place, and time. No cranial nerve deficit. She exhibits normal muscle tone. Coordination normal.  No ataxia on finger to nose bilaterally. No pronator drift. 5/5 strength throughout. CN 2-12 intact. Negative Romberg. Equal grip strength. Sensation intact. Gait is normal.   Skin: Skin is warm.  Psychiatric: She has a normal mood and affect. Her behavior is normal.  Nursing note and vitals reviewed.   ED Course  Procedures (including critical care time) Labs Review Labs Reviewed - No data to display  Imaging Review Dg Wrist Complete Left  07/04/2015   CLINICAL DATA:  Status post fall, with left wrist pain. Initial encounter.  EXAM: LEFT WRIST - COMPLETE 3+ VIEW  COMPARISON:  None.  FINDINGS: There is no evidence of fracture or dislocation. The carpal rows are intact, and demonstrate normal alignment. The joint spaces are preserved. The radioulnar articulation appears somewhat widened, though this may be developmental in nature.  No significant soft tissue abnormalities are seen.  IMPRESSION: No evidence of fracture or dislocation.   Electronically Signed   By: Roanna Raider M.D.   On: 07/04/2015 04:41   Dg Knee Complete 4 Views Left  07/04/2015   CLINICAL DATA:  Acute onset of left knee pain.  Initial encounter.  EXAM: LEFT KNEE - COMPLETE 4+ VIEW  COMPARISON:  None.  FINDINGS: There is no evidence of  fracture or dislocation. The joint spaces are preserved. No significant degenerative change is seen; the patellofemoral joint is grossly unremarkable in appearance.  No significant joint effusion is seen. The visualized soft tissues are normal in appearance.  IMPRESSION: No evidence of fracture or dislocation.   Electronically Signed   By: Roanna Raider M.D.   On: 07/04/2015 04:43   I have personally reviewed and evaluated these images and lab results as part of my medical decision-making.   EKG Interpretation None      MDM   Final diagnoses:  Fall, initial encounter  Left knee pain   Knee pain after fall 2 days ago. Ongoing wrist pain for several weeks likely due to repetitive motion. She did not injure her wrist in the fall.  Xrays negative.  Patient able to bear weight.  No tibial plateau tenderness.  Wrist splint given and repetitive motions injuries discussed. Treat with RICE, NSAIDs, followup with PCP. Return precautions discussed.   Glynn Octave, MD 07/04/15 1247

## 2015-10-13 ENCOUNTER — Emergency Department (HOSPITAL_COMMUNITY): Payer: Managed Care, Other (non HMO)

## 2015-10-13 ENCOUNTER — Encounter (HOSPITAL_COMMUNITY): Payer: Self-pay

## 2015-10-13 ENCOUNTER — Emergency Department (HOSPITAL_COMMUNITY)
Admission: EM | Admit: 2015-10-13 | Discharge: 2015-10-13 | Disposition: A | Discharge status: Home / Self Care | Payer: Managed Care, Other (non HMO) | Attending: Emergency Medicine | Admitting: Emergency Medicine

## 2015-10-13 DIAGNOSIS — Z3202 Encounter for pregnancy test, result negative: Secondary | ICD-10-CM | POA: Insufficient documentation

## 2015-10-13 DIAGNOSIS — R51 Headache: Secondary | ICD-10-CM | POA: Insufficient documentation

## 2015-10-13 DIAGNOSIS — K429 Umbilical hernia without obstruction or gangrene: Secondary | ICD-10-CM | POA: Insufficient documentation

## 2015-10-13 DIAGNOSIS — F17210 Nicotine dependence, cigarettes, uncomplicated: Secondary | ICD-10-CM | POA: Insufficient documentation

## 2015-10-13 DIAGNOSIS — I10 Essential (primary) hypertension: Secondary | ICD-10-CM | POA: Insufficient documentation

## 2015-10-13 DIAGNOSIS — Z9889 Other specified postprocedural states: Secondary | ICD-10-CM | POA: Insufficient documentation

## 2015-10-13 LAB — URINALYSIS, ROUTINE W REFLEX MICROSCOPIC
Bilirubin Urine: NEGATIVE
Glucose, UA: NEGATIVE mg/dL
HGB URINE DIPSTICK: NEGATIVE
Ketones, ur: NEGATIVE mg/dL
NITRITE: NEGATIVE
PROTEIN: NEGATIVE mg/dL
Specific Gravity, Urine: <1.005 — ABNORMAL LOW (ref 1.005–1.030)
pH: 6 (ref 5.0–8.0)

## 2015-10-13 LAB — CBC WITH DIFFERENTIAL/PLATELET
Basophils Absolute: 0 10*3/uL (ref 0.0–0.1)
Basophils Relative: 0 %
Eosinophils Absolute: 0.1 10*3/uL (ref 0.0–0.7)
Eosinophils Relative: 1 %
HCT: 38.5 % (ref 36.0–46.0)
Hemoglobin: 12.4 g/dL (ref 12.0–15.0)
Lymphocytes Relative: 23 %
Lymphs Abs: 2.2 10*3/uL (ref 0.7–4.0)
MCH: 24.6 pg — AB (ref 26.0–34.0)
MCHC: 32.2 g/dL (ref 30.0–36.0)
MCV: 76.4 fL — ABNORMAL LOW (ref 78.0–100.0)
MONO ABS: 0.7 10*3/uL (ref 0.1–1.0)
MONOS PCT: 7 %
Neutro Abs: 6.5 10*3/uL (ref 1.7–7.7)
Neutrophils Relative %: 69 %
PLATELETS: 209 10*3/uL (ref 150–400)
RBC: 5.04 MIL/uL (ref 3.87–5.11)
RDW: 14.3 % (ref 11.5–15.5)
WBC: 9.5 10*3/uL (ref 4.0–10.5)

## 2015-10-13 LAB — URINE MICROSCOPIC-ADD ON
Bacteria, UA: NONE SEEN
RBC / HPF: NONE SEEN RBC/hpf (ref 0–5)
Squamous Epithelial / LPF: NONE SEEN

## 2015-10-13 LAB — COMPREHENSIVE METABOLIC PANEL
ALT: 14 U/L (ref 14–54)
AST: 19 U/L (ref 15–41)
Albumin: 4.1 g/dL (ref 3.5–5.0)
Alkaline Phosphatase: 45 U/L (ref 38–126)
Anion gap: 7 (ref 5–15)
BILIRUBIN TOTAL: 0.7 mg/dL (ref 0.3–1.2)
BUN: 6 mg/dL (ref 6–20)
CHLORIDE: 107 mmol/L (ref 101–111)
CO2: 25 mmol/L (ref 22–32)
Calcium: 9.3 mg/dL (ref 8.9–10.3)
Creatinine, Ser: 0.94 mg/dL (ref 0.44–1.00)
Glucose, Bld: 102 mg/dL — ABNORMAL HIGH (ref 65–99)
POTASSIUM: 3.7 mmol/L (ref 3.5–5.1)
Sodium: 139 mmol/L (ref 135–145)
TOTAL PROTEIN: 7.1 g/dL (ref 6.5–8.1)

## 2015-10-13 LAB — LIPASE, BLOOD: LIPASE: 16 U/L (ref 11–51)

## 2015-10-13 LAB — PREGNANCY, URINE: Preg Test, Ur: NEGATIVE

## 2015-10-13 MED ORDER — HYDROMORPHONE HCL 4 MG PO TABS: 4.0000 mg | ORAL_TABLET | Freq: Four times a day (QID) | ORAL | Status: DC | PRN

## 2015-10-13 MED ORDER — ONDANSETRON HCL 4 MG/2ML IJ SOLN
4.0000 mg | Freq: Once | INTRAMUSCULAR | Status: AC
  Administered 2015-10-13: 4 mg via INTRAVENOUS
  Filled 2015-10-13: qty 2

## 2015-10-13 MED ORDER — SODIUM CHLORIDE 0.9 % IV BOLUS (SEPSIS)
500.0000 mL | Freq: Once | INTRAVENOUS | Status: AC
  Administered 2015-10-13: 500 mL via INTRAVENOUS

## 2015-10-13 MED ORDER — IOHEXOL 300 MG/ML  SOLN
100.0000 mL | Freq: Once | INTRAMUSCULAR | Status: AC | PRN
  Administered 2015-10-13: 100 mL via INTRAVENOUS

## 2015-10-13 MED ORDER — LISINOPRIL 20 MG PO TABS: 20.0000 mg | ORAL_TABLET | Freq: Every day | ORAL | Status: DC

## 2015-10-13 MED ORDER — SODIUM CHLORIDE 0.9 % IV SOLN
INTRAVENOUS | Status: DC
  Administered 2015-10-13: 11:00:00 via INTRAVENOUS

## 2015-10-13 MED ORDER — FENTANYL CITRATE (PF) 100 MCG/2ML IJ SOLN
50.0000 ug | Freq: Once | INTRAMUSCULAR | Status: AC
  Administered 2015-10-13: 50 ug via INTRAVENOUS
  Filled 2015-10-13: qty 2

## 2015-10-13 MED ORDER — LISINOPRIL-HYDROCHLOROTHIAZIDE 20-12.5 MG PO TABS: 1.0000 | ORAL_TABLET | Freq: Every day | ORAL | Status: DC

## 2015-10-13 MED ORDER — AMLODIPINE BESYLATE 5 MG PO TABS: 5.0000 mg | ORAL_TABLET | Freq: Every day | ORAL | Status: AC

## 2015-10-13 MED ORDER — DIATRIZOATE MEGLUMINE & SODIUM 66-10 % PO SOLN
ORAL | Status: AC
  Administered 2015-10-13: 15 mL
  Filled 2015-10-13: qty 30

## 2015-10-13 NOTE — ED Provider Notes (Addendum)
CSN: [id number]     Arrival date & time 10/13/15  4098 History  By signing my name below, I, Marica Otter, attest that this documentation has been prepared under the direction and in the presence of Vanetta Mulders, MD. Electronically Signed: Marica Otter, ED Scribe. 10/13/2015. 10:26 AM.  Chief Complaint  Patient presents with  . Abdominal Pain   Patient is a 40 y.o. female presenting with abdominal pain. The history is provided by the patient. No language interpreter was used.  Abdominal Pain Pain location:  Periumbilical Pain radiates to:  Does not radiate Pain severity:  Severe Onset quality:  Sudden Duration:  1 day Timing:  Intermittent Progression:  Unchanged Chronicity:  New Associated symptoms: no chest pain, no chills, no diarrhea, no dysuria, no fever, no hematuria, no nausea, no shortness of breath, no sore throat and no vomiting    PCP: Evlyn Courier, MD HPI Comments: Katherine Ward is a 40 y.o. female, with PMHx noted below with known abd hernia and HTN (controlled with lisinopril), who presents to the Emergency Department complaining of severe, 8/10, intermittent, non-radiating periumbilical abd pain onset last night. Pt confirms intermittent headache over the past week noting that she ran out of her BP meds and has not taken lisinopril over the past week. Pt, however, denies fever, chills, rhinorrhea, cough, sore throat, visual disturbances, SOB, n/v/d, dysuria, hematuria, leg swelling, new back pain (pt notes chronic, intermittent back pain at baseline), new rash, headache. Pt further denies any Hx of bleeding easily.   Past Medical History  Diagnosis Date  . Hypertension    Past Surgical History  Procedure Laterality Date  . Hernia repair    . Cesarean section     Family History  Problem Relation Age of Onset  . Hypertension Mother   . Hypertension Father   . Bipolar disorder Paternal Aunt    Social History  Substance Use Topics  . Smoking status: Current  Every Day Smoker -- 0.25 packs/day for 12 years    Types: Cigarettes  . Smokeless tobacco: Never Used  . Alcohol Use: Yes     Comment: occasionally    OB History    No data available     Review of Systems  Constitutional: Negative for fever and chills.  HENT: Negative for congestion, rhinorrhea and sore throat.   Eyes: Negative for visual disturbance.  Respiratory: Negative for shortness of breath.   Cardiovascular: Negative for chest pain.  Gastrointestinal: Positive for abdominal pain. Negative for nausea, vomiting and diarrhea.  Genitourinary: Negative for dysuria and hematuria.  Musculoskeletal: Negative for back pain.  Skin: Negative for rash.  Neurological: Positive for headaches.  Hematological: Does not bruise/bleed easily.  Psychiatric/Behavioral: Negative for confusion.   Allergies  Acetaminophen  Home Medications   Prior to Admission medications   Medication Sig Start Date End Date Taking? Authorizing Provider  ibuprofen (ADVIL,MOTRIN) 200 MG tablet Take 400 mg by mouth every 6 (six) hours as needed for mild pain or moderate pain.   Yes Historical Provider, MD  lisinopril (PRINIVIL,ZESTRIL) 20 MG tablet Take 1 tablet (20 mg total) by mouth daily. For high blood pressure 12/18/14  Yes Shuvon B Rankin, NP  HYDROmorphone (DILAUDID) 4 MG tablet Take 1 tablet (4 mg total) by mouth every 6 (six) hours as needed for severe pain. 10/13/15   Vanetta Mulders, MD   Triage Vitals: BP 183/106 mmHg  Pulse 73  Temp(Src) 98.3 F (36.8 C) (Oral)  Resp 20  Ht 5\' 4"  (1.626  m)  Wt 159 lb (72.122 kg)  BMI 27.28 kg/m2  SpO2 98%  LMP 09/23/2015 Physical Exam  Constitutional: She is oriented to person, place, and time. She appears well-developed and well-nourished.  HENT:  Head: Normocephalic.  Mouth/Throat: Mucous membranes are normal.  Eyes: EOM are normal. Pupils are equal, round, and reactive to light. No scleral icterus.  Eyes track normal  Neck: Normal range of motion.   Cardiovascular: Normal rate and regular rhythm.   Pulmonary/Chest: Effort normal and breath sounds normal. No respiratory distress.  Abdominal: Soft. Bowel sounds are normal. She exhibits no distension and no mass (No mass at umbilicis ). There is tenderness in the periumbilical area.  Musculoskeletal: Normal range of motion. She exhibits no edema.  Neurological: She is alert and oriented to person, place, and time.  Psychiatric: She has a normal mood and affect.  Nursing note and vitals reviewed.   ED Course  Procedures (including critical care time) DIAGNOSTIC STUDIES: Oxygen Saturation is 98% on ra, nl by my interpretation.    COORDINATION OF CARE: 10:23 AM: Discussed treatment plan which includes labs with pt at bedside; patient verbalizes understanding and agrees with treatment plan.  Labs Review Labs Reviewed  CBC WITH DIFFERENTIAL/PLATELET - Abnormal; Notable for the following:    MCV 76.4 (*)    MCH 24.6 (*)    All other components within normal limits  COMPREHENSIVE METABOLIC PANEL - Abnormal; Notable for the following:    Glucose, Bld 102 (*)    All other components within normal limits  URINALYSIS, ROUTINE W REFLEX MICROSCOPIC (NOT AT [facility]) - Abnormal; Notable for the following:    Specific Gravity, Urine <1.005 (*)    Leukocytes, UA TRACE (*)    All other components within normal limits  PREGNANCY, URINE  LIPASE, BLOOD  URINE MICROSCOPIC-ADD ON   Results for orders placed or performed during the hospital encounter of 10/13/15  CBC with Differential  Result Value Ref Range   WBC 9.5 4.0 - 10.5 K/uL   RBC 5.04 3.87 - 5.11 MIL/uL   Hemoglobin 12.4 12.0 - 15.0 g/dL   HCT 16.1 09.6 - 04.5 %   MCV 76.4 (L) 78.0 - 100.0 fL   MCH 24.6 (L) 26.0 - 34.0 pg   MCHC 32.2 30.0 - 36.0 g/dL   RDW 40.9 81.1 - 91.4 %   Platelets 209 150 - 400 K/uL   Neutrophils Relative % 69 %   Neutro Abs 6.5 1.7 - 7.7 K/uL   Lymphocytes Relative 23 %   Lymphs Abs 2.2 0.7 - 4.0 K/uL    Monocytes Relative 7 %   Monocytes Absolute 0.7 0.1 - 1.0 K/uL   Eosinophils Relative 1 %   Eosinophils Absolute 0.1 0.0 - 0.7 K/uL   Basophils Relative 0 %   Basophils Absolute 0.0 0.0 - 0.1 K/uL  Comprehensive metabolic panel  Result Value Ref Range   Sodium 139 135 - 145 mmol/L   Potassium 3.7 3.5 - 5.1 mmol/L   Chloride 107 101 - 111 mmol/L   CO2 25 22 - 32 mmol/L   Glucose, Bld 102 (H) 65 - 99 mg/dL   BUN 6 6 - 20 mg/dL   Creatinine, Ser 7.82 0.44 - 1.00 mg/dL   Calcium 9.3 8.9 - 95.6 mg/dL   Total Protein 7.1 6.5 - 8.1 g/dL   Albumin 4.1 3.5 - 5.0 g/dL   AST 19 15 - 41 U/L   ALT 14 14 - 54 U/L   Alkaline Phosphatase  45 38 - 126 U/L   Total Bilirubin 0.7 0.3 - 1.2 mg/dL   GFR calc non Af Amer >60 >60 mL/min   GFR calc Af Amer >60 >60 mL/min   Anion gap 7 5 - 15  Urinalysis, Routine w reflex microscopic (not at [facility])  Result Value Ref Range   Color, Urine YELLOW YELLOW   APPearance CLEAR CLEAR   Specific Gravity, Urine <1.005 (L) 1.005 - 1.030   pH 6.0 5.0 - 8.0   Glucose, UA NEGATIVE NEGATIVE mg/dL   Hgb urine dipstick NEGATIVE NEGATIVE   Bilirubin Urine NEGATIVE NEGATIVE   Ketones, ur NEGATIVE NEGATIVE mg/dL   Protein, ur NEGATIVE NEGATIVE mg/dL   Nitrite NEGATIVE NEGATIVE   Leukocytes, UA TRACE (A) NEGATIVE  Pregnancy, urine  Result Value Ref Range   Preg Test, Ur NEGATIVE NEGATIVE  Lipase, blood  Result Value Ref Range   Lipase 16 11 - 51 U/L  Urine microscopic-add on  Result Value Ref Range   Squamous Epithelial / LPF NONE SEEN NONE SEEN   WBC, UA 0-5 0 - 5 WBC/hpf   RBC / HPF NONE SEEN 0 - 5 RBC/hpf   Bacteria, UA NONE SEEN NONE SEEN     Imaging Review Ct Abdomen Pelvis W Contrast  10/13/2015  CLINICAL DATA:  Nausea and vomiting, periumbilical pain starting last night, history of umbilical hernia repair EXAM: CT ABDOMEN AND PELVIS WITH CONTRAST TECHNIQUE: Multidetector CT imaging of the abdomen and pelvis was performed using the standard protocol  following bolus administration of intravenous contrast. CONTRAST:  100mL OMNIPAQUE IOHEXOL 300 MG/ML  SOLN COMPARISON:  01/10/2008 FINDINGS: Sagittal images of the spine shows mild degenerative changes lumbar spine. Mild atherosclerotic calcifications of distal abdominal aorta and iliac arteries are noted. The lung bases are unremarkable. Enhanced liver is unremarkable. No calcified gallstones are noted within gallbladder. No aortic aneurysm. The pancreas, spleen and adrenal glands are unremarkable. Kidneys are symmetrical in size and enhancement. No hydronephrosis or hydroureter. Moderate stool noted in right colon and transverse colon. No small bowel obstruction. No ascites or free air. No adenopathy. There is a tiny left paramedian umbilical hernia containing fat measures 9 mm without evidence of acute complication. Small right midline supraumbilical hernia containing fat is noted in axial image 35 measures about 1.5 cm. This is confirmed in sagittal image 49. There is no evidence of acute complication or inflammatory changes. Tiny cyst is noted in right hepatic dome measures 8 mm. No small bowel obstruction. The terminal ileum is unremarkable. No ascites or free air. No adenopathy. The appendix is not identified. There is a right ovarian cyst measures 3.8 x 3.5 cm. Measures 22 Hounsfield units in attenuation. Slight hemorrhagic cyst cannot be excluded. Normal size anteflexed uterus. The left ovary is unremarkable. The urinary bladder is unremarkable. No pelvic free fluid. No distal colonic obstruction. There is no inguinal adenopathy. No inguinal hernia. IMPRESSION: 1. Tiny cyst is noted in right hepatic dome measures 8 mm. 2. There is a small supraumbilical hernia containing fat measures 1.5 cm best seen in axial image 35 and sagittal image 49. A tiny left paramedian umbilical hernia containing fat measures 8 mm. There is no evidence of acute complication or hernia inflammation. 3. Moderate stool noted in right  colon and cecum. No pericecal inflammation. Appendix is not identified. 4. No small bowel obstruction. 5. Question slight hemorrhagic cyst within right ovary measures 3.8 x 3.5 cm. Further correlation with pelvic ultrasound could be performed as clinically warranted. No  pelvic free fluid. Unremarkable anteflexed uterus and left ovary. Electronically Signed   By: Natasha Mead M.D.   On: 10/13/2015 12:47   I have personally reviewed and evaluated these lab results as part of my medical decision-making.  MDM   Final diagnoses:  Umbilical hernia without obstruction and without gangrene    CT scan shows 2 areas of recurrent umbilical herniation no evidence of any entrapped bowel or incarceration. Both areas just containing fat. Patient with some symptoms particularly with lifting patient nontoxic no acute distress. Referral to general surgery for consideration for repair made. Patient will be given a work note to allow her to rest for a couple days.  I personally performed the services described in this documentation, which was scribed in my presence. The recorded information has been reviewed and is accurate.      Vanetta Mulders, MD 10/13/15 1338  The patient's blood pressure medicines verified by pharmacy tech. She's been out of her medications for about a week. Based on today's workup we can renew those.  Vanetta Mulders, MD 10/13/15 1413

## 2015-10-13 NOTE — ED Notes (Signed)
Patient received correct RX for HTN. Patient with no complaints at this time. Respirations even and unlabored. Skin warm/dry. Discharge instructions reviewed with patient at this time. Patient given opportunity to voice concerns/ask questions. IV removed per policy and band-aid applied to site. Patient discharged at this time and left Emergency Department with steady gait.

## 2015-10-13 NOTE — Discharge Instructions (Signed)
Hernia A hernia happens when an organ or tissue inside your body pushes out through a weak spot in the belly (abdomen). HOME CARE  Avoid stretching or overusing (straining) the muscles near the hernia.  Do not lift anything heavier than 10 lb (4.5 kg).  Use the muscles in your leg when you lift something up. Do not use the muscles in your back.  When you cough, try to cough gently.  Eat a diet that has a lot of fiber. Eat lots of fruits and vegetables.  Drink enough fluids to keep your pee (urine) clear or pale yellow. Try to drink 6-8 glasses of water a day.  Take medicines to make your poop soft (stool softeners) as told by your doctor.  Lose weight, if you are overweight.  Do not use any tobacco products, including cigarettes, chewing tobacco, or electronic cigarettes. If you need help quitting, ask your doctor.  Keep all follow-up visits as told by your doctor. This is important. GET HELP IF:  The skin by the hernia gets puffy (swollen) or red.  The hernia is painful. GET HELP RIGHT AWAY IF:  You have a fever.  You have belly pain that is getting worse.  You feel sick to your stomach (nauseous) or you throw up (vomit).  You cannot push the hernia back in place by gently pressing on it while you are lying down.  The hernia:  Changes in shape or size.  Is stuck outside your belly.  Changes color.  Feels hard or tender.   This information is not intended to replace advice given to you by your health care provider. Make sure you discuss any questions you have with your health care provider.    Workup here today does confirm to areas of umbilical herniation no entrapped bowel. Make an appointment to follow-up with general surgery. Take pain medicine as needed. Work note provided. Return for development of worse pain vomiting or fevers.   Document Released: 03/07/2010 Document Revised: 10/08/2014 Document Reviewed: 07/28/2014 Elsevier Interactive Patient Education  [employer]

## 2015-10-13 NOTE — ED Notes (Addendum)
Patient needed RX for BP meds. States meds in chart are different. Pharmacy tech in to try and further update medication list.

## 2015-10-13 NOTE — ED Notes (Addendum)
Pt reports pain around umbilicus with n/v since last night.  Denies diarrhea.  LBM was yesterday and was normal per pt.  Denies any urinary or vaginal symptoms.   Also reports has been out of her bp medication for the past week.

## 2015-11-16 ENCOUNTER — Encounter (HOSPITAL_COMMUNITY): Payer: Self-pay | Admitting: Emergency Medicine

## 2015-11-16 ENCOUNTER — Emergency Department (HOSPITAL_COMMUNITY)
Admission: EM | Admit: 2015-11-16 | Discharge: 2015-11-16 | Disposition: A | Discharge status: Home / Self Care | Payer: Managed Care, Other (non HMO) | Attending: Emergency Medicine | Admitting: Emergency Medicine

## 2015-11-16 DIAGNOSIS — I10 Essential (primary) hypertension: Secondary | ICD-10-CM | POA: Insufficient documentation

## 2015-11-16 DIAGNOSIS — F17210 Nicotine dependence, cigarettes, uncomplicated: Secondary | ICD-10-CM | POA: Insufficient documentation

## 2015-11-16 DIAGNOSIS — Z79899 Other long term (current) drug therapy: Secondary | ICD-10-CM | POA: Insufficient documentation

## 2015-11-16 DIAGNOSIS — R112 Nausea with vomiting, unspecified: Secondary | ICD-10-CM | POA: Insufficient documentation

## 2015-11-16 DIAGNOSIS — Z3202 Encounter for pregnancy test, result negative: Secondary | ICD-10-CM | POA: Insufficient documentation

## 2015-11-16 LAB — URINALYSIS, ROUTINE W REFLEX MICROSCOPIC
Glucose, UA: NEGATIVE mg/dL
NITRITE: NEGATIVE
PH: 5.5 (ref 5.0–8.0)
Protein, ur: 30 mg/dL — AB
Specific Gravity, Urine: 1.025 (ref 1.005–1.030)

## 2015-11-16 LAB — URINE MICROSCOPIC-ADD ON

## 2015-11-16 LAB — PREGNANCY, URINE: PREG TEST UR: NEGATIVE

## 2015-11-16 MED ORDER — ONDANSETRON 4 MG PO TBDP: 4.0000 mg | ORAL_TABLET | Freq: Three times a day (TID) | ORAL | Status: DC | PRN

## 2015-11-16 MED ORDER — ONDANSETRON 4 MG PO TBDP
4.0000 mg | ORAL_TABLET | Freq: Once | ORAL | Status: AC | PRN
  Administered 2015-11-16: 4 mg via ORAL
  Filled 2015-11-16: qty 1

## 2015-11-16 NOTE — Discharge Instructions (Signed)

## 2015-11-16 NOTE — ED Provider Notes (Signed)
CSN: [id number]     Arrival date & time 11/16/15  0730 History   First MD Initiated Contact with Patient 11/16/15 0805     Chief Complaint  Patient presents with  . Emesis     (Consider location/radiation/quality/duration/timing/severity/associated sxs/prior Treatment) Patient is a 40 y.o. female presenting with vomiting. The history is provided by the patient. No language interpreter was used.  Emesis Severity:  Moderate Duration:  1 day Timing:  Constant Number of daily episodes:  Multiple Quality:  Undigested food Progression:  Worsening Chronicity:  New Context: not self-induced   Relieved by:  Nothing Worsened by:  Nothing tried Ineffective treatments:  None tried Associated symptoms: no abdominal pain   Risk factors: sick contacts   Risk factors: no alcohol use     Past Medical History  Diagnosis Date  . Hypertension    Past Surgical History  Procedure Laterality Date  . Hernia repair    . Cesarean section     Family History  Problem Relation Age of Onset  . Hypertension Mother   . Hypertension Father   . Bipolar disorder Paternal Aunt    Social History  Substance Use Topics  . Smoking status: Current Every Day Smoker -- 0.50 packs/day for 12 years    Types: Cigarettes  . Smokeless tobacco: Never Used  . Alcohol Use: Yes     Comment: occasionally    OB History    No data available     Review of Systems  Gastrointestinal: Positive for vomiting. Negative for abdominal pain.  All other systems reviewed and are negative.     Allergies  Acetaminophen  Home Medications   Prior to Admission medications   Medication Sig Start Date End Date Taking? Authorizing Provider  amLODipine (NORVASC) 5 MG tablet Take 1 tablet (5 mg total) by mouth daily. 10/13/15   Vanetta Mulders, MD  HYDROmorphone (DILAUDID) 4 MG tablet Take 1 tablet (4 mg total) by mouth every 6 (six) hours as needed for severe pain. 10/13/15   Vanetta Mulders, MD  ibuprofen (ADVIL,MOTRIN)  200 MG tablet Take 400 mg by mouth every 6 (six) hours as needed for mild pain or moderate pain.    Historical Provider, MD  lisinopril-hydrochlorothiazide (PRINZIDE,ZESTORETIC) 20-12.5 MG tablet Take 1 tablet by mouth daily. 10/13/15   Vanetta Mulders, MD   BP 143/106 mmHg  Pulse 88  Temp(Src) 97.6 F (36.4 C) (Oral)  Resp 14  Ht  (1.626 m)  Wt 74.39 kg  BMI 28.14 kg/m2  SpO2 99%  LMP 10/21/2015 Physical Exam  Constitutional: She is oriented to person, place, and time. She appears well-developed and well-nourished.  HENT:  Head: Normocephalic and atraumatic.  Right Ear: External ear normal.  Nose: Nose normal.  Mouth/Throat: Oropharynx is clear and moist.  Eyes: Conjunctivae and EOM are normal. Pupils are equal, round, and reactive to light.  Neck: Normal range of motion.  Cardiovascular: Normal rate and normal heart sounds.   Pulmonary/Chest: Effort normal and breath sounds normal.  Abdominal: Soft. She exhibits no distension.  Musculoskeletal: Normal range of motion.  Neurological: She is alert and oriented to person, place, and time.  Skin: Skin is warm.  Psychiatric: She has a normal mood and affect.  Nursing note and vitals reviewed.   ED Course  Procedures (including critical care time) Labs Review Labs Reviewed  URINALYSIS, ROUTINE W REFLEX MICROSCOPIC (NOT AT [facility]) - Abnormal; Notable for the following:    Hgb urine dipstick SMALL (*)    Bilirubin  Urine SMALL (*)    Ketones, ur TRACE (*)    Protein, ur 30 (*)    Leukocytes, UA SMALL (*)    All other components within normal limits  URINE MICROSCOPIC-ADD ON - Abnormal; Notable for the following:    Squamous Epithelial / LPF TOO NUMEROUS TO COUNT (*)    Bacteria, UA FEW (*)    All other components within normal limits  PREGNANCY, URINE    Imaging Review No results found. I have personally reviewed and evaluated these images and lab results as part of my medical decision-making.   EKG  Interpretation None      MDM pt given zofran odt.  Pt denies current nausea.  Pt tolerating po fluids well   Final diagnoses:  Non-intractable vomiting with nausea, vomiting of unspecified type   Meds ordered this encounter  Medications  . ondansetron (ZOFRAN-ODT) disintegrating tablet 4 mg    Sig:   . ondansetron (ZOFRAN ODT) 4 MG disintegrating tablet    Sig: Take 1 tablet (4 mg total) by mouth every 8 (eight) hours as needed for nausea or vomiting.    Dispense:  20 tablet    Refill:  0    Order Specific Question:  Supervising Provider    Answer:  Eber Hong [[id number]]  An After Visit Summary was printed and given to the patient.    Lonia Skinner [place], PA-C 11/16/15 1610  Vanetta Mulders, MD 11/17/15 [phone]

## 2015-11-16 NOTE — ED Notes (Signed)
Pt reports n/v/d that began at 0430 this morning. Pt states she unable to tolerate anything PO. Denies abdominal pain. States he daughter has been sick with GI virus.

## 2015-12-21 ENCOUNTER — Emergency Department (HOSPITAL_COMMUNITY)
Admission: EM | Admit: 2015-12-21 | Discharge: 2015-12-22 | Disposition: A | Discharge status: Home / Self Care | Payer: MEDICAID | Attending: Emergency Medicine | Admitting: Emergency Medicine

## 2015-12-21 ENCOUNTER — Encounter (HOSPITAL_COMMUNITY): Payer: Self-pay | Admitting: Emergency Medicine

## 2015-12-21 DIAGNOSIS — R112 Nausea with vomiting, unspecified: Secondary | ICD-10-CM

## 2015-12-21 DIAGNOSIS — Z79899 Other long term (current) drug therapy: Secondary | ICD-10-CM | POA: Insufficient documentation

## 2015-12-21 DIAGNOSIS — R197 Diarrhea, unspecified: Secondary | ICD-10-CM

## 2015-12-21 DIAGNOSIS — A599 Trichomoniasis, unspecified: Secondary | ICD-10-CM

## 2015-12-21 DIAGNOSIS — F17210 Nicotine dependence, cigarettes, uncomplicated: Secondary | ICD-10-CM | POA: Insufficient documentation

## 2015-12-21 DIAGNOSIS — I10 Essential (primary) hypertension: Secondary | ICD-10-CM | POA: Insufficient documentation

## 2015-12-21 LAB — URINE MICROSCOPIC-ADD ON

## 2015-12-21 LAB — CBC
HCT: 36.4 % (ref 36.0–46.0)
Hemoglobin: 12 g/dL (ref 12.0–15.0)
MCH: 25.1 pg — ABNORMAL LOW (ref 26.0–34.0)
MCHC: 33 g/dL (ref 30.0–36.0)
MCV: 76.2 fL — ABNORMAL LOW (ref 78.0–100.0)
PLATELETS: 203 10*3/uL (ref 150–400)
RBC: 4.78 MIL/uL (ref 3.87–5.11)
RDW: 13.7 % (ref 11.5–15.5)
WBC: 7.6 10*3/uL (ref 4.0–10.5)

## 2015-12-21 LAB — URINALYSIS, ROUTINE W REFLEX MICROSCOPIC
Bilirubin Urine: NEGATIVE
Glucose, UA: NEGATIVE mg/dL
KETONES UR: NEGATIVE mg/dL
NITRITE: NEGATIVE
PROTEIN: NEGATIVE mg/dL
Specific Gravity, Urine: 1.03 (ref 1.005–1.030)
pH: 6 (ref 5.0–8.0)

## 2015-12-21 LAB — COMPREHENSIVE METABOLIC PANEL
ALT: 12 U/L — AB (ref 14–54)
AST: 22 U/L (ref 15–41)
Albumin: 3.8 g/dL (ref 3.5–5.0)
Alkaline Phosphatase: 45 U/L (ref 38–126)
Anion gap: 6 (ref 5–15)
BILIRUBIN TOTAL: 0.5 mg/dL (ref 0.3–1.2)
BUN: 9 mg/dL (ref 6–20)
CHLORIDE: 109 mmol/L (ref 101–111)
CO2: 25 mmol/L (ref 22–32)
CREATININE: 0.96 mg/dL (ref 0.44–1.00)
Calcium: 8.6 mg/dL — ABNORMAL LOW (ref 8.9–10.3)
Glucose, Bld: 98 mg/dL (ref 65–99)
Potassium: 3.4 mmol/L — ABNORMAL LOW (ref 3.5–5.1)
Sodium: 140 mmol/L (ref 135–145)
TOTAL PROTEIN: 6.7 g/dL (ref 6.5–8.1)

## 2015-12-21 LAB — LIPASE, BLOOD: LIPASE: 25 U/L (ref 11–51)

## 2015-12-21 LAB — POC URINE PREG, ED: PREG TEST UR: NEGATIVE

## 2015-12-21 MED ORDER — METRONIDAZOLE 500 MG PO TABS
2000.0000 mg | ORAL_TABLET | Freq: Once | ORAL | Status: AC
  Administered 2015-12-22: 2000 mg via ORAL
  Filled 2015-12-21: qty 4

## 2015-12-21 MED ORDER — ONDANSETRON HCL 4 MG/2ML IJ SOLN: 4.0000 mg | Freq: Once | INTRAMUSCULAR | Status: DC | PRN

## 2015-12-21 MED ORDER — CEFTRIAXONE SODIUM 250 MG IJ SOLR
250.0000 mg | Freq: Once | INTRAMUSCULAR | Status: AC
  Administered 2015-12-22: 250 mg via INTRAMUSCULAR
  Filled 2015-12-21: qty 250

## 2015-12-21 MED ORDER — ONDANSETRON HCL 4 MG/2ML IJ SOLN
4.0000 mg | Freq: Once | INTRAMUSCULAR | Status: AC
  Administered 2015-12-22: 4 mg via INTRAVENOUS
  Filled 2015-12-21: qty 2

## 2015-12-21 MED ORDER — AZITHROMYCIN 250 MG PO TABS
1000.0000 mg | ORAL_TABLET | Freq: Once | ORAL | Status: AC
  Administered 2015-12-22: 1000 mg via ORAL
  Filled 2015-12-21: qty 4

## 2015-12-21 MED ORDER — DICYCLOMINE HCL 10 MG/ML IM SOLN
20.0000 mg | Freq: Once | INTRAMUSCULAR | Status: AC
  Administered 2015-12-22: 20 mg via INTRAMUSCULAR
  Filled 2015-12-21: qty 2

## 2015-12-21 MED ORDER — LOPERAMIDE HCL 2 MG PO CAPS
4.0000 mg | ORAL_CAPSULE | Freq: Once | ORAL | Status: AC
  Administered 2015-12-22: 4 mg via ORAL
  Filled 2015-12-21: qty 2

## 2015-12-21 MED ORDER — SODIUM CHLORIDE 0.9 % IV BOLUS (SEPSIS)
1000.0000 mL | Freq: Once | INTRAVENOUS | Status: AC
  Administered 2015-12-21: 1000 mL via INTRAVENOUS

## 2015-12-21 NOTE — ED Notes (Signed)
Pt c/o vomiting since yesterday with abd pain.

## 2015-12-21 NOTE — ED Provider Notes (Signed)
By signing my name below, I, Marisue Humble, attest that this documentation has been prepared under the direction and in the presence of Charli Liberatore N Tangi Shroff, DO . Electronically Signed: Marisue Humble, Scribe. 12/21/2015. 11:44 PM.  TIME SEEN: 11:41 PM  CHIEF COMPLAINT: Emesis, diarrhea   HPI: HPI Comments:  Katherine Ward is a 40 y.o. female with PMHx of HTN who presents to the Emergency Department complaining of 2 episodes of vomiting yesterday. Pt reports associated 5-6 episodes of diarrhea and generalized abdominal cramping today. She states her last sexual encounter was 1 month ago and she did use protection. Pt states she has had trichomonas in the past. LNMP ended a few days ago. She denies vaginal bleeding or discharge. She also reports past c-section and hernia repair. She has had a sick contact with the flu. Pt denies abnormal vaginal discharge, abnormal vaginal bleeding, dysuria, hematuria, fever, chills, or recent travel outside the country.   ROS: See HPI Constitutional: no fever  Eyes: no drainage  ENT: no runny nose   Cardiovascular:  no chest pain  Resp: no SOB  GI: vomiting GU: no dysuria Integumentary: no rash  Allergy: no hives  Musculoskeletal: no leg swelling  Neurological: no slurred speech ROS otherwise negative  PAST MEDICAL HISTORY/PAST SURGICAL HISTORY:  Past Medical History  Diagnosis Date  . Hypertension     MEDICATIONS:  Prior to Admission medications   Medication Sig Start Date End Date Taking? Authorizing Provider  amLODipine (NORVASC) 5 MG tablet Take 1 tablet (5 mg total) by mouth daily. 10/13/15  Yes Vanetta Mulders, MD  lisinopril-hydrochlorothiazide (PRINZIDE,ZESTORETIC) 20-12.5 MG tablet Take 1 tablet by mouth daily. 10/13/15  Yes Vanetta Mulders, MD  HYDROmorphone (DILAUDID) 4 MG tablet Take 1 tablet (4 mg total) by mouth every 6 (six) hours as needed for severe pain. Patient not taking: Reported on 11/16/2015 10/13/15   Vanetta Mulders, MD   ibuprofen (ADVIL,MOTRIN) 200 MG tablet Take 400 mg by mouth every 6 (six) hours as needed for mild pain or moderate pain.    Historical Provider, MD  ondansetron (ZOFRAN ODT) 4 MG disintegrating tablet Take 1 tablet (4 mg total) by mouth every 8 (eight) hours as needed for nausea or vomiting. Patient not taking: Reported on 12/21/2015 11/16/15   Elson Areas, PA-C    ALLERGIES:  Allergies  Allergen Reactions  . Acetaminophen     Gives patient a very high fever    SOCIAL HISTORY:  Social History  Substance Use Topics  . Smoking status: Current Every Day Smoker -- 0.50 packs/day for 12 years    Types: Cigarettes  . Smokeless tobacco: Never Used  . Alcohol Use: Yes     Comment: occasionally     FAMILY HISTORY: Family History  Problem Relation Age of Onset  . Hypertension Mother   . Hypertension Father   . Bipolar disorder Paternal Aunt     EXAM: BP 180/106 mmHg  Pulse 58  Temp(Src) 98.6 F (37 C) (Oral)  Resp 17  Ht  (1.626 m)  Wt 154 lb (69.854 kg)  BMI 26.42 kg/m2  SpO2 100%  LMP 12/20/2015 CONSTITUTIONAL: Alert and oriented and responds appropriately to questions. Well-appearing; well-nourished HEAD: Normocephalic EYES: Conjunctivae clear, PERRL ENT: normal nose; no rhinorrhea; moist mucous membranes NECK: Supple, no meningismus, no LAD  CARD: RRR; S1 and S2 appreciated; no murmurs, no clicks, no rubs, no gallops RESP: Normal chest excursion without splinting or tachypnea; breath sounds clear and equal bilaterally; no wheezes, no  rhonchi, no rales, no hypoxia or respiratory distress, speaking full sentences ABD/GI: Normal bowel sounds; non-distended; soft, non-tender, no rebound, no guarding, no peritoneal signs BACK:  The back appears normal and is non-tender to palpation, there is no CVA tenderness EXT: Normal ROM in all joints; non-tender to palpation; no edema; normal capillary refill; no cyanosis, no calf tenderness or swelling    SKIN: Normal color for  age and race; warm; no rash NEURO: Moves all extremities equally, sensation to light touch intact diffusely, cranial nerves II through XII intact PSYCH: The patient's mood and manner are appropriate. Grooming and personal hygiene are appropriate.  MEDICAL DECISION MAKING: Patient here with complaints of nausea, vomiting and diarrhea. Abdominal exam is benign. Suspect viral illness. We'll treat symptomatically with IV fluids, Zofran, Imodium and Bentyl.  Patient's labs are unremarkable. Urine shows moderate hemoglobin and small leukocytes but also many squamous cells. Suspect dirty catch. She is not having any urinary symptoms or lower abdominal pain. No flank pain on exam.  At this time I do not feel she needs emergent abdominal imaging. Doubt appendicitis, colitis, bowel obstruction, diverticulitis, pyelonephritis.  ED PROGRESS: Patient reports feeling much better. Drinking without difficulty. No further vomiting or diarrhea in the emergency department. Her urine did show Trichomonas. We have treated her for trichomonas as well as gonorrhea and chlamydia. She denies any GU symptoms. Have advised her that all of her partners will need to be tested and treated as well she'll need to hold off from sexual intercourse at least 1 week until she and all of her partners have been treated. She declines pelvic exam. Have offered syphilis and HIV testing which she states she had done recently with her PCP Dr. Loleta ChanceHill. I feel she is safe to be discharged. We'll discharge with medications for nausea and diarrhea. Discussed return precautions. She verbalized understanding and is comfortable with this plan.    I personally performed the services described in this documentation, which was scribed in my presence. The recorded information has been reviewed and is accurate.   Layla MawKristen N Dolphus Linch, DO 12/22/15 [phone]

## 2015-12-22 MED ORDER — LIDOCAINE HCL (PF) 1 % IJ SOLN
INTRAMUSCULAR | Status: AC
  Administered 2015-12-22: 2.1 mL
  Filled 2015-12-22: qty 5

## 2015-12-22 MED ORDER — PROMETHAZINE HCL 25 MG PO TABS: 25.0000 mg | ORAL_TABLET | Freq: Four times a day (QID) | ORAL | Status: DC | PRN

## 2015-12-22 MED ORDER — LOPERAMIDE HCL 2 MG PO CAPS: 2.0000 mg | ORAL_CAPSULE | Freq: Four times a day (QID) | ORAL | Status: DC | PRN

## 2015-12-22 NOTE — ED Notes (Signed)
Patient verbalizes understanding of discharge instructions, prescription medications, home care and follow up care. Patient out of department at this time. 

## 2015-12-22 NOTE — ED Notes (Signed)
Fluid challenge performed; pt has consumed 8oz water and 8 oz ginger ale without problems

## 2015-12-22 NOTE — Discharge Instructions (Signed)
You're seen in the emergency department for nausea, vomiting and diarrhea. I suspect this is from a viral illness. Urine showed that you have trichomonas. We have treated for Trichomonas, gonorrhea and chlamydia. Please follow-up with your primary care physician as needed. Your recent sexual partners will need to be notified that you were positive for an STD. Please no sexual intercourse for at least 1 week after treatment and until your partner has also been treated.   Nausea and Vomiting Nausea is a sick feeling that often comes before throwing up (vomiting). Vomiting is a reflex where stomach contents come out of your mouth. Vomiting can cause severe loss of body fluids (dehydration). Children and elderly adults can become dehydrated quickly, especially if they also have diarrhea. Nausea and vomiting are symptoms of a condition or disease. It is important to find the cause of your symptoms. CAUSES   Direct irritation of the stomach lining. This irritation can result from increased acid production (gastroesophageal reflux disease), infection, food poisoning, taking certain medicines (such as nonsteroidal anti-inflammatory drugs), alcohol use, or tobacco use.  Signals from the brain.These signals could be caused by a headache, heat exposure, an inner ear disturbance, increased pressure in the brain from injury, infection, a tumor, or a concussion, pain, emotional stimulus, or metabolic problems.  An obstruction in the gastrointestinal tract (bowel obstruction).  Illnesses such as diabetes, hepatitis, gallbladder problems, appendicitis, kidney problems, cancer, sepsis, atypical symptoms of a heart attack, or eating disorders.  Medical treatments such as chemotherapy and radiation.  Receiving medicine that makes you sleep (general anesthetic) during surgery. DIAGNOSIS Your caregiver may ask for tests to be done if the problems do not improve after a few days. Tests may also be done if symptoms are  severe or if the reason for the nausea and vomiting is not clear. Tests may include:  Urine tests.  Blood tests.  Stool tests.  Cultures (to look for evidence of infection).  X-rays or other imaging studies. Test results can help your caregiver make decisions about treatment or the need for additional tests. TREATMENT You need to stay well hydrated. Drink frequently but in small amounts.You may wish to drink water, sports drinks, clear broth, or eat frozen ice pops or gelatin dessert to help stay hydrated.When you eat, eating slowly may help prevent nausea.There are also some antinausea medicines that may help prevent nausea. HOME CARE INSTRUCTIONS   Take all medicine as directed by your caregiver.  If you do not have an appetite, do not force yourself to eat. However, you must continue to drink fluids.  If you have an appetite, eat a normal diet unless your caregiver tells you differently.  Eat a variety of complex carbohydrates (rice, wheat, potatoes, bread), lean meats, yogurt, fruits, and vegetables.  Avoid high-fat foods because they are more difficult to digest.  Drink enough water and fluids to keep your urine clear or pale yellow.  If you are dehydrated, ask your caregiver for specific rehydration instructions. Signs of dehydration may include:  Severe thirst.  Dry lips and mouth.  Dizziness.  Dark urine.  Decreasing urine frequency and amount.  Confusion.  Rapid breathing or pulse. SEEK IMMEDIATE MEDICAL CARE IF:   You have blood or brown flecks (like coffee grounds) in your vomit.  You have black or bloody stools.  You have a severe headache or stiff neck.  You are confused.  You have severe abdominal pain.  You have chest pain or trouble breathing.  You do not  urinate at least once every 8 hours.  You develop cold or clammy skin.  You continue to vomit for longer than 24 to 48 hours.  You have a fever. MAKE SURE YOU:   Understand these  instructions.  Will watch your condition.  Will get help right away if you are not doing well or get worse.   This information is not intended to replace advice given to you by your health care provider. Make sure you discuss any questions you have with your health care provider.   Document Released: 09/17/2005 Document Revised: 12/10/2011 Document Reviewed: 02/14/2011 Elsevier Interactive Patient Education 2016 [employer] Inc[employer]  Diarrhea Diarrhea is frequent loose and watery bowel movements. It can cause you to feel weak and dehydrated. Dehydration can cause you to become tired and thirsty, have a dry mouth, and have decreased urination that often is dark yellow. Diarrhea is a sign of another problem, most often an infection that will not last long. In most cases, diarrhea typically lasts 2-3 days. However, it can last longer if it is a sign of something more serious. It is important to treat your diarrhea as directed by your caregiver to lessen or prevent future episodes of diarrhea. CAUSES  Some common causes include:  Gastrointestinal infections caused by viruses, bacteria, or parasites.  Food poisoning or food allergies.  Certain medicines, such as antibiotics, chemotherapy, and laxatives.  Artificial sweeteners and fructose.  Digestive disorders. HOME CARE INSTRUCTIONS  Ensure adequate fluid intake (hydration): Have 1 cup (8 oz) of fluid for each diarrhea episode. Avoid fluids that contain simple sugars or sports drinks, fruit juices, whole milk products, and sodas. Your urine should be clear or pale yellow if you are drinking enough fluids. Hydrate with an oral rehydration solution that you can purchase at pharmacies, retail stores, and online. You can prepare an oral rehydration solution at home by mixing the following ingredients together:   - tsp table salt.   tsp baking soda.   tsp salt substitute containing potassium chloride.  1  tablespoons sugar.  1 L (34 oz) of  water.  Certain foods and beverages may increase the speed at which food moves through the gastrointestinal (GI) tract. These foods and beverages should be avoided and include:  Caffeinated and alcoholic beverages.  High-fiber foods, such as raw fruits and vegetables, nuts, seeds, and whole grain breads and cereals.  Foods and beverages sweetened with sugar alcohols, such as xylitol, sorbitol, and mannitol.  Some foods may be well tolerated and may help thicken stool including:  Starchy foods, such as rice, toast, pasta, low-sugar cereal, oatmeal, grits, baked potatoes, crackers, and bagels.  Bananas.  Applesauce.  Add probiotic-rich foods to help increase healthy bacteria in the GI tract, such as yogurt and fermented milk products.  Wash your hands well after each diarrhea episode.  Only take over-the-counter or prescription medicines as directed by your caregiver.  Take a warm bath to relieve any burning or pain from frequent diarrhea episodes. SEEK IMMEDIATE MEDICAL CARE IF:   You are unable to keep fluids down.  You have persistent vomiting.  You have blood in your stool, or your stools are black and tarry.  You do not urinate in 6-8 hours, or there is only a small amount of very dark urine.  You have abdominal pain that increases or localizes.  You have weakness, dizziness, confusion, or light-headedness.  You have a severe headache.  Your diarrhea gets worse or does not get better.  You have  a fever or persistent symptoms for more than 2-3 days.  You have a fever and your symptoms suddenly get worse. MAKE SURE YOU:   Understand these instructions.  Will watch your condition.  Will get help right away if you are not doing well or get worse.   This information is not intended to replace advice given to you by your health care provider. Make sure you discuss any questions you have with your health care provider.   Document Released: 09/07/2002 Document  Revised: 10/08/2014 Document Reviewed: 05/25/2012 Elsevier Interactive Patient Education 2016 [employer]  Trichomoniasis Trichomoniasis is an infection caused by an organism called Trichomonas. The infection can affect both women and men. In women, the outer female genitalia and the vagina are affected. In men, the penis is mainly affected, but the prostate and other reproductive organs can also be involved. Trichomoniasis is a sexually transmitted infection (STI) and is most often passed to another person through sexual contact.  RISK FACTORS  Having unprotected sexual intercourse.  Having sexual intercourse with an infected partner. SIGNS AND SYMPTOMS  Symptoms of trichomoniasis in women include:  Abnormal gray-green frothy vaginal discharge.  Itching and irritation of the vagina.  Itching and irritation of the area outside the vagina. Symptoms of trichomoniasis in men include:   Penile discharge with or without pain.  Pain during urination. This results from inflammation of the urethra. DIAGNOSIS  Trichomoniasis may be found during a Pap test or physical exam. Your health care provider may use one of the following methods to help diagnose this infection:  Testing the pH of the vagina with a test tape.  Using a vaginal swab test that checks for the Trichomonas organism. A test is available that provides results within a few minutes.  Examining a urine sample.  Testing vaginal secretions. Your health care provider may test you for other STIs, including HIV. TREATMENT   You may be given medicine to fight the infection. Women should inform their health care provider if they could be or are pregnant. Some medicines used to treat the infection should not be taken during pregnancy.  Your health care provider may recommend over-the-counter medicines or creams to decrease itching or irritation.  Your sexual partner will need to be treated if infected.  Your health care  provider may test you for infection again 3 months after treatment. HOME CARE INSTRUCTIONS   Take medicines only as directed by your health care provider.  Take over-the-counter medicine for itching or irritation as directed by your health care provider.  Do not have sexual intercourse while you have the infection.  Women should not douche or wear tampons while they have the infection.  Discuss your infection with your partner. Your partner may have gotten the infection from you, or you may have gotten it from your partner.  Have your sex partner get examined and treated if necessary.  Practice safe, informed, and protected sex.  See your health care provider for other STI testing. SEEK MEDICAL CARE IF:   You still have symptoms after you finish your medicine.  You develop abdominal pain.  You have pain when you urinate.  You have bleeding after sexual intercourse.  You develop a rash.  Your medicine makes you sick or makes you throw up (vomit). MAKE SURE YOU:  Understand these instructions.  Will watch your condition.  Will get help right away if you are not doing well or get worse.   This information is not intended to replace  advice given to you by your health care provider. Make sure you discuss any questions you have with your health care provider.   Document Released: 03/13/2001 Document Revised: 10/08/2014 Document Reviewed: 06/29/2013 Elsevier Interactive Patient Education [employer]

## 2016-04-19 ENCOUNTER — Emergency Department (HOSPITAL_COMMUNITY)
Admission: EM | Admit: 2016-04-19 | Discharge: 2016-04-19 | Disposition: A | Discharge status: Home / Self Care | Payer: Self-pay | Attending: Emergency Medicine | Admitting: Emergency Medicine

## 2016-04-19 ENCOUNTER — Encounter (HOSPITAL_COMMUNITY): Payer: Self-pay

## 2016-04-19 ENCOUNTER — Emergency Department (HOSPITAL_COMMUNITY): Payer: Self-pay

## 2016-04-19 DIAGNOSIS — W1840XA Slipping, tripping and stumbling without falling, unspecified, initial encounter: Secondary | ICD-10-CM | POA: Insufficient documentation

## 2016-04-19 DIAGNOSIS — Y929 Unspecified place or not applicable: Secondary | ICD-10-CM | POA: Insufficient documentation

## 2016-04-19 DIAGNOSIS — Y939 Activity, unspecified: Secondary | ICD-10-CM | POA: Insufficient documentation

## 2016-04-19 DIAGNOSIS — I10 Essential (primary) hypertension: Secondary | ICD-10-CM | POA: Insufficient documentation

## 2016-04-19 DIAGNOSIS — F17210 Nicotine dependence, cigarettes, uncomplicated: Secondary | ICD-10-CM | POA: Insufficient documentation

## 2016-04-19 DIAGNOSIS — S5012XA Contusion of left forearm, initial encounter: Secondary | ICD-10-CM | POA: Insufficient documentation

## 2016-04-19 DIAGNOSIS — Y999 Unspecified external cause status: Secondary | ICD-10-CM | POA: Insufficient documentation

## 2016-04-19 MED ORDER — IBUPROFEN 400 MG PO TABS
600.0000 mg | ORAL_TABLET | Freq: Once | ORAL | Status: AC
Start: 2016-04-19 — End: 2016-04-19
  Administered 2016-04-19: 600 mg via ORAL
  Filled 2016-04-19: qty 2

## 2016-04-19 NOTE — Discharge Instructions (Signed)
Elevate your arm. Use the ice packs for pain and to reduce the swelling. Wear the Ace wrap for comfort. You can take ibuprofen 600 mg 4 times a day as needed for pain. If you continue to have pain in the next week he can be rechecked by the orthopedist on-call, Dr. Romeo AppleHarrison. Call his office for an appointment. Contusion A contusion is a deep bruise. Contusions are the result of a blunt injury to tissues and muscle fibers under the skin. The injury causes bleeding under the skin. The skin overlying the contusion may turn blue, purple, or yellow. Minor injuries will give you a painless contusion, but more severe contusions may stay painful and swollen for a few weeks.  CAUSES  This condition is usually caused by a blow, trauma, or direct force to an area of the body. SYMPTOMS  Symptoms of this condition include:  Swelling of the injured area.  Pain and tenderness in the injured area.  Discoloration. The area may have redness and then turn blue, purple, or yellow. DIAGNOSIS  This condition is diagnosed based on a physical exam and medical history. An X-ray, CT scan, or MRI may be needed to determine if there are any associated injuries, such as broken bones (fractures). TREATMENT  Specific treatment for this condition depends on what area of the body was injured. In general, the best treatment for a contusion is resting, icing, applying pressure to (compression), and elevating the injured area. This is often called the RICE strategy. Over-the-counter anti-inflammatory medicines may also be recommended for pain control.  HOME CARE INSTRUCTIONS   Rest the injured area.  If directed, apply ice to the injured area:  Put ice in a plastic bag.  Place a towel between your skin and the bag.  Leave the ice on for 20 minutes, 2-3 times per day.  If directed, apply light compression to the injured area using an elastic bandage. Make sure the bandage is not wrapped too tightly. Remove and reapply the  bandage as directed by your health care provider.  If possible, raise (elevate) the injured area above the level of your heart while you are sitting or lying down.  Take over-the-counter and prescription medicines only as told by your health care provider. SEEK MEDICAL CARE IF:  Your symptoms do not improve after several days of treatment.  Your symptoms get worse.  You have difficulty moving the injured area. SEEK IMMEDIATE MEDICAL CARE IF:   You have severe pain.  You have numbness in a hand or foot.  Your hand or foot turns pale or cold.   This information is not intended to replace advice given to you by your health care provider. Make sure you discuss any questions you have with your health care provider.   Document Released: 06/27/2005 Document Revised: 06/08/2015 Document Reviewed: 02/02/2015 Elsevier Interactive Patient Education [employer]2016 [employer]vier Inc.

## 2016-04-19 NOTE — ED Provider Notes (Signed)
CSN: [id number]     Arrival date & time 04/19/16  0445 History   First MD Initiated Contact with Patient 04/19/16 05:05 AM   Chief Complaint  Patient presents with  . Arm Injury     (Consider location/radiation/quality/duration/timing/severity/associated sxs/prior Treatment) HPI patient states that July 19 she was trying to move a 42 inch flat screen TV by herself and it slipped and hit her on her left wrist. She states she had immediate pain and swelling that has persisted. This happened about 11:30 yesterday morning. Patient states she is right handed. She did not fall to the ground.  PCP Dr Loleta ChanceHill  Past Medical History  Diagnosis Date  . Hypertension    Past Surgical History  Procedure Laterality Date  . Hernia repair    . Cesarean section     Family History  Problem Relation Age of Onset  . Hypertension Mother   . Hypertension Father   . Bipolar disorder Paternal Aunt    Social History  Substance Use Topics  . Smoking status: Current Every Day Smoker -- 0.50 packs/day for 12 years    Types: Cigarettes  . Smokeless tobacco: Never Used  . Alcohol Use: Yes     Comment: occasionally      OB History    No data available     Review of Systems  All other systems reviewed and are negative.     Allergies  Acetaminophen  Home Medications   Prior to Admission medications   Medication Sig Start Date End Date Taking? Authorizing Provider  amLODipine (NORVASC) 5 MG tablet Take 1 tablet (5 mg total) by mouth daily. 10/13/15  Yes Vanetta MuldersScott Zackowski, MD  lisinopril-hydrochlorothiazide (PRINZIDE,ZESTORETIC) 20-12.5 MG tablet Take 1 tablet by mouth daily. 10/13/15  Yes Vanetta MuldersScott Zackowski, MD  ibuprofen (ADVIL,MOTRIN) 200 MG tablet Take 400 mg by mouth every 6 (six) hours as needed for mild pain or moderate pain.    Historical Provider, MD  loperamide (IMODIUM) 2 MG capsule Take 1 capsule (2 mg total) by mouth 4 (four) times daily as needed for diarrhea or loose stools. 12/22/15    Kristen N Ward, DO  promethazine (PHENERGAN) 25 MG tablet Take 1 tablet (25 mg total) by mouth every 6 (six) hours as needed for nausea or vomiting. 12/22/15   Kristen N Ward, DO   BP 151/91 mmHg  Pulse 80  Temp(Src) 98.1 F (36.7 C) (Oral)  Resp 17  Ht 5\' 4"  (1.626 m)  Wt 152 lb (68.947 kg)  BMI 26.08 kg/m2  SpO2 98%  LMP 04/07/2016  Vital signs normal   Physical Exam  Constitutional: She is oriented to person, place, and time. She appears well-developed and well-nourished.  Non-toxic appearance. She does not appear ill. No distress.  Patient is eating snacks from the waiting room  HENT:  Head: Normocephalic and atraumatic.  Right Ear: External ear normal.  Left Ear: External ear normal.  Nose: Nose normal. No mucosal edema or rhinorrhea.  Mouth/Throat: Mucous membranes are normal. No dental abscesses or uvula swelling.  Eyes: Conjunctivae and EOM are normal.  Neck: Normal range of motion and full passive range of motion without pain.  Cardiovascular: Normal rate.   Pulmonary/Chest: Effort normal. No respiratory distress. She has no rhonchi. She exhibits no crepitus.  Abdominal: Normal appearance.  Musculoskeletal: Normal range of motion. She exhibits edema and tenderness.  Moves all extremities well except for her left upper extremity. Patient has obvious swelling just proximal to the radial aspect of her wrist.  She has extremely limited dorsiflexion at the wrist due to pain, she also has extremely limited supination due to pain. Her pulses are intact and sensation is intact.  Neurological: She is alert and oriented to person, place, and time. She has normal strength. No cranial nerve deficit.  Skin: Skin is warm, dry and intact. No rash noted. No erythema. No pallor.  Psychiatric: She has a normal mood and affect. Her speech is normal and behavior is normal. Her mood appears not anxious.  Nursing note and vitals reviewed.   ED Course  Procedures (including critical care  time)  Medications  ibuprofen (ADVIL,MOTRIN) tablet 600 mg (not administered)   Patient was given ice pack. She was given ibuprofen for pain. She is allergic to acetaminophen. After reviewing her x-ray and Ace wrap was applied to her wrist for comfort. She was advised to treat her injury with elevation, ice, and use the Ace wrap for comfort. She should be rechecked by an orthopedist if she still painful in the next week.    Imaging Review Dg Forearm Left  04/19/2016  CLINICAL DATA:  Dropped a TV on left forearm on Wednesday. Swelling and pain distally. Initial encounter. EXAM: LEFT FOREARM - 2 VIEW COMPARISON:  07/04/2015 wrist radiography. FINDINGS: There is no evidence of fracture or other focal bone lesions. Soft tissues are unremarkable. IMPRESSION: Negative. Electronically Signed   By: Marnee Spring M.D.   On: 04/19/2016 05:21   I have personally reviewed and evaluated these images and lab results as part of my medical decision-making.    MDM   Final diagnoses:  Contusion, forearm, left, initial encounter    Plan discharge  Devoria Albe, MD, Concha Pyo, MD 04/19/16 [phone]

## 2016-04-19 NOTE — ED Notes (Signed)
Pt states she dropped a tv on her left forearm on Wednesday am, has swelling and pain to same

## 2017-10-28 ENCOUNTER — Other Ambulatory Visit: Payer: Self-pay

## 2017-10-28 ENCOUNTER — Encounter (HOSPITAL_COMMUNITY): Payer: Self-pay | Admitting: Emergency Medicine

## 2017-10-28 ENCOUNTER — Emergency Department (HOSPITAL_COMMUNITY)
Admission: EM | Admit: 2017-10-28 | Discharge: 2017-10-28 | Disposition: A | Discharge status: Home / Self Care | Payer: BLUE CROSS/BLUE SHIELD | Attending: Emergency Medicine | Admitting: Emergency Medicine

## 2017-10-28 DIAGNOSIS — S61431A Puncture wound without foreign body of right hand, initial encounter: Secondary | ICD-10-CM | POA: Diagnosis not present

## 2017-10-28 DIAGNOSIS — W458XXA Other foreign body or object entering through skin, initial encounter: Secondary | ICD-10-CM | POA: Diagnosis not present

## 2017-10-28 DIAGNOSIS — Y999 Unspecified external cause status: Secondary | ICD-10-CM | POA: Insufficient documentation

## 2017-10-28 DIAGNOSIS — Z23 Encounter for immunization: Secondary | ICD-10-CM | POA: Diagnosis not present

## 2017-10-28 DIAGNOSIS — Z79899 Other long term (current) drug therapy: Secondary | ICD-10-CM | POA: Insufficient documentation

## 2017-10-28 DIAGNOSIS — I10 Essential (primary) hypertension: Secondary | ICD-10-CM

## 2017-10-28 DIAGNOSIS — F17210 Nicotine dependence, cigarettes, uncomplicated: Secondary | ICD-10-CM | POA: Diagnosis not present

## 2017-10-28 DIAGNOSIS — S60921A Unspecified superficial injury of right hand, initial encounter: Secondary | ICD-10-CM | POA: Diagnosis present

## 2017-10-28 DIAGNOSIS — Y93E5 Activity, floor mopping and cleaning: Secondary | ICD-10-CM | POA: Insufficient documentation

## 2017-10-28 DIAGNOSIS — Y929 Unspecified place or not applicable: Secondary | ICD-10-CM | POA: Insufficient documentation

## 2017-10-28 MED ORDER — LISINOPRIL-HYDROCHLOROTHIAZIDE 20-12.5 MG PO TABS: 1.0000 | ORAL_TABLET | Freq: Every day | ORAL | 1 refills | Status: DC

## 2017-10-28 MED ORDER — BACITRACIN ZINC 500 UNIT/GM EX OINT
1.0000 "application " | TOPICAL_OINTMENT | Freq: Two times a day (BID) | CUTANEOUS | Status: DC
  Filled 2017-10-28: qty 0.9

## 2017-10-28 MED ORDER — TETANUS-DIPHTH-ACELL PERTUSSIS 5-2.5-18.5 LF-MCG/0.5 IM SUSP
0.5000 mL | Freq: Once | INTRAMUSCULAR | Status: AC
  Administered 2017-10-28: 0.5 mL via INTRAMUSCULAR
  Filled 2017-10-28: qty 0.5

## 2017-10-28 MED ORDER — BACITRACIN ZINC 500 UNIT/GM EX OINT: 1.0000 "application " | TOPICAL_OINTMENT | Freq: Two times a day (BID) | CUTANEOUS | 0 refills | Status: DC

## 2017-10-28 NOTE — Discharge Instructions (Signed)
Please apply bacitracin ointment twice daily, keep a sterile dressing on this wound, you may clean the wound with soapy warm water as needed, please seek medical attention for severe or worsening symptoms.  We have updated your tetanus, this will be good for the next 10 years.  I have also given you a prescription for your blood pressure medication, you may take this daily

## 2017-10-28 NOTE — ED Notes (Signed)
Patient's BP elevated in Triage, 189/112. Patient states she has taken her BP med.

## 2017-10-28 NOTE — ED Triage Notes (Signed)
Patient reports she cut her hand on a rusted broom handle. Small wound to her R palmar surface. No bleeding.

## 2017-10-28 NOTE — ED Provider Notes (Signed)
[facility] EMERGENCY DEPARTMENT Provider Note   CSN: [id number] Arrival date & time: 10/28/17  1739     History   Chief Complaint Chief Complaint  Patient presents with  . Abrasion    HPI Katherine Ward is a 42 y.o. female.  HPI  The pt is a 42 y/o female States she was at work using a broom when the rusty end of the broom handle punctured through her gloved hand on the R and went into her skin with small am't of bleeding - this has stopped, she has a bandage and cleaned it and has minimal pain - this occurred acute in onset, 4 hours ago - sxa re mild and constant.  Not utd on TDAP  Past Medical History:  Diagnosis Date  . Hypertension     Patient Active Problem List   Diagnosis Date Noted  . Essential hypertension 12/17/2014  . MDD (major depressive disorder), recurrent episode, severe (HCC) 12/16/2014  . Suicidal thoughts 04/09/2014    Past Surgical History:  Procedure Laterality Date  . CESAREAN SECTION    . HERNIA REPAIR      OB History    Gravida Para Term Preterm AB Living   3 3 3          SAB TAB Ectopic Multiple Live Births                   Home Medications    Prior to Admission medications   Medication Sig Start Date End Date Taking? Authorizing Provider  amLODipine (NORVASC) 5 MG tablet Take 1 tablet (5 mg total) by mouth daily. 10/13/15  Yes Vanetta Mulders, MD  bacitracin ointment Apply 1 application topically 2 (two) times daily. 10/28/17   Eber Hong, MD  lisinopril-hydrochlorothiazide (ZESTORETIC) 20-12.5 MG tablet Take 1 tablet by mouth daily. 10/28/17   Eber Hong, MD  lisinopril (PRINIVIL,ZESTRIL) 20 MG tablet Take 1 tablet (20 mg total) by mouth daily. For high blood pressure 10/13/15 10/13/15  Vanetta Mulders, MD    Family History Family History  Problem Relation Age of Onset  . Hypertension Mother   . Hypertension Father   . Bipolar disorder Paternal Aunt     Social History Social History   Tobacco Use  . Smoking  status: Current Every Day Smoker    Packs/day: 0.10    Years: 12.00    Pack years: 1.20    Types: Cigarettes  . Smokeless tobacco: Never Used  Substance Use Topics  . Alcohol use: Yes    Comment: occasionally   . Drug use: No    Comment: denies     Allergies   Acetaminophen   Review of Systems Review of Systems  Constitutional: Negative for fever.  Skin: Positive for wound.     Physical Exam Updated Vital Signs BP (!) 189/112 (BP Location: Right Arm)   Pulse 81   Temp 98.4 F (36.9 C) (Oral)   Resp 16   Ht 5\' 4"  (1.626 m)   Wt 68 kg (150 lb)   LMP 10/21/2017   SpO2 98%   BMI 25.75 kg/m   Physical Exam  Constitutional: She appears well-developed and well-nourished.  HENT:  Head: Normocephalic and atraumatic.  Eyes: Conjunctivae are normal. Right eye exhibits no discharge. Left eye exhibits no discharge.  Pulmonary/Chest: Effort normal. No respiratory distress.  Neurological: She is alert. Coordination normal.  Skin: Skin is warm and dry. No rash noted. She is not diaphoretic. No erythema.  Very small skin tear to  the right ulnar surface of the hand over the mid metacarpal region, very superficial, no bleeding, no foreign body, minimal tenderness  Psychiatric: She has a normal mood and affect.  Nursing note and vitals reviewed.    ED Treatments / Results  Labs (all labs ordered are listed, but only abnormal results are displayed) Labs Reviewed - No data to display   Radiology No results found.  Procedures Procedures (including critical care time)  Medications Ordered in ED Medications  bacitracin ointment 1 application (not administered)  Tdap (BOOSTRIX) injection 0.5 mL (0.5 mLs Intramuscular Given 10/28/17 1828)     Initial Impression / Assessment and Plan / ED Course  I have reviewed the triage vital signs and the nursing notes.  Pertinent labs & imaging results that were available during my care of the patient were reviewed by me and  considered in my medical decision making (see chart for details).     The patient is concerned because of the rusty metal exposure, this wound was cleaned in the ER, she had a sterile dressing placed with bacitracin, there is no signs of infection, no deep puncture, no foreign body, the patient had her tetanus status updated, stable for discharge, given wound care instructions and expressed her understanding  Blood pressure elevated, patient states she has run out of her home blood pressure medication, this was addressed and the patient was given a prescription for a refill  Final Clinical Impressions(s) / ED Diagnoses   Final diagnoses:  Puncture wound of right hand without foreign body, initial encounter  Essential hypertension    ED Discharge Orders        Ordered    bacitracin ointment  2 times daily     10/28/17 1839    lisinopril-hydrochlorothiazide (ZESTORETIC) 20-12.5 MG tablet  Daily     10/28/17 1840       Eber HongMiller, Deshundra Waller, MD 10/28/17 1840

## 2017-11-09 ENCOUNTER — Emergency Department (HOSPITAL_COMMUNITY)
Admission: EM | Admit: 2017-11-09 | Discharge: 2017-11-09 | Disposition: A | Discharge status: Home / Self Care | Payer: BLUE CROSS/BLUE SHIELD | Attending: Emergency Medicine | Admitting: Emergency Medicine

## 2017-11-09 ENCOUNTER — Encounter (HOSPITAL_COMMUNITY): Payer: Self-pay

## 2017-11-09 ENCOUNTER — Emergency Department (HOSPITAL_COMMUNITY): Payer: BLUE CROSS/BLUE SHIELD

## 2017-11-09 DIAGNOSIS — F17210 Nicotine dependence, cigarettes, uncomplicated: Secondary | ICD-10-CM | POA: Insufficient documentation

## 2017-11-09 DIAGNOSIS — R51 Headache: Secondary | ICD-10-CM | POA: Diagnosis present

## 2017-11-09 DIAGNOSIS — I10 Essential (primary) hypertension: Secondary | ICD-10-CM | POA: Insufficient documentation

## 2017-11-09 DIAGNOSIS — Z79899 Other long term (current) drug therapy: Secondary | ICD-10-CM | POA: Diagnosis not present

## 2017-11-09 DIAGNOSIS — F4321 Adjustment disorder with depressed mood: Secondary | ICD-10-CM | POA: Diagnosis not present

## 2017-11-09 DIAGNOSIS — R519 Headache, unspecified: Secondary | ICD-10-CM

## 2017-11-09 LAB — URINALYSIS, ROUTINE W REFLEX MICROSCOPIC
BILIRUBIN URINE: NEGATIVE
GLUCOSE, UA: NEGATIVE mg/dL
Ketones, ur: NEGATIVE mg/dL
NITRITE: NEGATIVE
PH: 6 (ref 5.0–8.0)
Protein, ur: NEGATIVE mg/dL
Specific Gravity, Urine: 1.003 — ABNORMAL LOW (ref 1.005–1.030)

## 2017-11-09 LAB — I-STAT TROPONIN, ED: TROPONIN I, POC: 0 ng/mL (ref 0.00–0.08)

## 2017-11-09 LAB — I-STAT CHEM 8, ED
BUN: 6 mg/dL (ref 6–20)
CHLORIDE: 103 mmol/L (ref 101–111)
Calcium, Ion: 1.18 mmol/L (ref 1.15–1.40)
Creatinine, Ser: 0.9 mg/dL (ref 0.44–1.00)
Glucose, Bld: 83 mg/dL (ref 65–99)
HEMATOCRIT: 44 % (ref 36.0–46.0)
HEMOGLOBIN: 15 g/dL (ref 12.0–15.0)
POTASSIUM: 4.1 mmol/L (ref 3.5–5.1)
Sodium: 140 mmol/L (ref 135–145)
TCO2: 26 mmol/L (ref 22–32)

## 2017-11-09 LAB — PREGNANCY, URINE: Preg Test, Ur: NEGATIVE

## 2017-11-09 MED ORDER — METOCLOPRAMIDE HCL 5 MG/ML IJ SOLN
10.0000 mg | Freq: Once | INTRAMUSCULAR | Status: AC
  Administered 2017-11-09: 10 mg via INTRAMUSCULAR
  Filled 2017-11-09: qty 2

## 2017-11-09 MED ORDER — DIPHENHYDRAMINE HCL 25 MG PO CAPS
50.0000 mg | ORAL_CAPSULE | Freq: Once | ORAL | Status: AC
  Administered 2017-11-09: 50 mg via ORAL
  Filled 2017-11-09: qty 2

## 2017-11-09 MED ORDER — METOCLOPRAMIDE HCL 10 MG PO TABS: 10.0000 mg | ORAL_TABLET | Freq: Four times a day (QID) | ORAL | 0 refills | Status: DC | PRN

## 2017-11-09 NOTE — ED Triage Notes (Signed)
Pt brought in by EMS due to high blood pressure, HA and dizziness. EMS reports pt had death in family last night and has been anxious. BP 172/121. Took lisinopril and norvasc this am . Reports HA started this am approx 0715

## 2017-11-09 NOTE — ED Provider Notes (Signed)
[facility] EMERGENCY DEPARTMENT Provider Note   CSN: [id number] Arrival date & time: 11/09/17  1002     History   Chief Complaint Chief Complaint  Patient presents with  . Hypertension    HPI Katherine Ward is a 42 y.o. female.  HPI  Pt was seen at 1030. Per pt, c/o gradual onset and persistence of constant frontal headache since 0715 this morning.  Describes the headache as located in her forehead. Pt states she had her BP checked at work and "it was high." Pt states she took her meds this morning. Pt endorses she "has been upset" since last night after a death in the family occurred. Denies HI/SI/AVH.  Denies headache was sudden or maximal in onset or at any time.  Denies visual changes, no focal motor weakness, no tingling/numbness in extremities, no fevers, no neck pain, no rash.     Past Medical History:  Diagnosis Date  . Hypertension     Patient Active Problem List   Diagnosis Date Noted  . Essential hypertension 12/17/2014  . MDD (major depressive disorder), recurrent episode, severe (HCC) 12/16/2014  . Suicidal thoughts 04/09/2014    Past Surgical History:  Procedure Laterality Date  . CESAREAN SECTION    . HERNIA REPAIR      OB History    Gravida Para Term Preterm AB Living   3 3 3          SAB TAB Ectopic Multiple Live Births                   Home Medications    Prior to Admission medications   Medication Sig Start Date End Date Taking? Authorizing Provider  amLODipine (NORVASC) 5 MG tablet Take 1 tablet (5 mg total) by mouth daily. 10/13/15   Vanetta Mulders, MD  bacitracin ointment Apply 1 application topically 2 (two) times daily. 10/28/17   Eber Hong, MD  lisinopril-hydrochlorothiazide (ZESTORETIC) 20-12.5 MG tablet Take 1 tablet by mouth daily. 10/28/17   Eber Hong, MD    Family History Family History  Problem Relation Age of Onset  . Hypertension Mother   . Hypertension Father   . Bipolar disorder Paternal Aunt     Social  History Social History   Tobacco Use  . Smoking status: Current Every Day Smoker    Packs/day: 0.10    Years: 12.00    Pack years: 1.20    Types: Cigarettes  . Smokeless tobacco: Never Used  Substance Use Topics  . Alcohol use: No    Frequency: Never  . Drug use: No    Comment: denies     Allergies   Acetaminophen   Review of Systems Review of Systems ROS: Statement: All systems negative except as marked or noted in the HPI; Constitutional: Negative for fever and chills. ; ; Eyes: Negative for eye pain, redness and discharge. ; ; ENMT: Negative for ear pain, hoarseness, nasal congestion, sinus pressure and sore throat. ; ; Cardiovascular: Negative for chest pain, palpitations, diaphoresis, dyspnea and peripheral edema. ; ; Respiratory: Negative for cough, wheezing and stridor. ; ; Gastrointestinal: Negative for nausea, vomiting, diarrhea, abdominal pain, blood in stool, hematemesis, jaundice and rectal bleeding. . ; ; Genitourinary: Negative for dysuria, flank pain and hematuria. ; ; Musculoskeletal: Negative for back pain and neck pain. Negative for swelling and trauma.; ; Skin: Negative for pruritus, rash, abrasions, blisters, bruising and skin lesion.; ; Neuro: +headache. Negative for lightheadedness and neck stiffness. Negative for weakness, altered level of  consciousness, altered mental status, extremity weakness, paresthesias, involuntary movement, seizure and syncope.; Psych:  +grief/sadness. No SI, no SA, no HI, no hallucinations.      Physical Exam Updated Vital Signs BP (!) 166/103 (BP Location: Left Arm)   Pulse 79   Temp 98.2 F (36.8 C)   Resp 16   Wt 68 kg (150 lb)   LMP 10/21/2017   SpO2 99%   BMI 25.75 kg/m    BP (!) 138/94   Pulse 64   Temp 98.2 F (36.8 C)   Resp 20   Wt 68 kg (150 lb)   LMP 10/21/2017   SpO2 99%   BMI 25.75 kg/m    Physical Exam 1035: Physical examination:  Nursing notes reviewed; Vital signs and O2 SAT reviewed;   Constitutional: Well developed, Well nourished, Well hydrated, In no acute distress; Head:  Normocephalic, atraumatic; Eyes: EOMI, PERRL, No scleral icterus; ENMT: TM's clear bilat. +edemetous nasal turbinates bilat with clear rhinorrhea. Mouth and pharynx normal, Mucous membranes moist; Neck: Supple, Full range of motion, No lymphadenopathy; Cardiovascular: Regular rate and rhythm, No gallop; Respiratory: Breath sounds clear & equal bilaterally, No wheezes.  Speaking full sentences with ease, Normal respiratory effort/excursion; Chest: Nontender, Movement normal; Abdomen: Soft, Nontender, Nondistended, Normal bowel sounds; Genitourinary: No CVA tenderness; Extremities: Pulses normal, No tenderness, No edema, No calf edema or asymmetry.; Neuro: AA&Ox3, Major CN grossly intact. No facial droop.  Speech clear. No gross focal motor or sensory deficits in extremities.; Skin: Color normal, Warm, Dry.   ED Treatments / Results  Labs (all labs ordered are listed, but only abnormal results are displayed)   EKG  EKG Interpretation  Date/Time:  Saturday November 09 2017 10:13:11 EST Ventricular Rate:  68 PR Interval:    QRS Duration: 82 QT Interval:  369 QTC Calculation: 393 R Axis:   65 Text Interpretation:  Sinus rhythm RSR' in V1 or V2, probably normal variant Artifact When compared with ECG of 12/17/2014 No significant change was found Confirmed by Samuel Jester [phone] on 11/09/2017 11:34:03 AM       Radiology   Procedures Procedures (including critical care time)  Medications Ordered in ED Medications  diphenhydrAMINE (BENADRYL) capsule 50 mg (50 mg Oral Given 11/09/17 1202)  metoCLOPramide (REGLAN) injection 10 mg (10 mg Intramuscular Given 11/09/17 1202)     Initial Impression / Assessment and Plan / ED Course  I have reviewed the triage vital signs and the nursing notes.  Pertinent labs & imaging results that were available during my care of the patient were reviewed by me and  considered in my medical decision making (see chart for details).   MDM Reviewed: previous chart, nursing note and vitals Reviewed previous: labs and ECG Interpretation: labs, ECG and CT scan   Results for orders placed or performed during the hospital encounter of 11/09/17  Urinalysis, Routine w reflex microscopic  Result Value Ref Range   Color, Urine STRAW (A) YELLOW   APPearance HAZY (A) CLEAR   Specific Gravity, Urine 1.003 (L) 1.005 - 1.030   pH 6.0 5.0 - 8.0   Glucose, UA NEGATIVE NEGATIVE mg/dL   Hgb urine dipstick SMALL (A) NEGATIVE   Bilirubin Urine NEGATIVE NEGATIVE   Ketones, ur NEGATIVE NEGATIVE mg/dL   Protein, ur NEGATIVE NEGATIVE mg/dL   Nitrite NEGATIVE NEGATIVE   Leukocytes, UA TRACE (A) NEGATIVE   RBC / HPF 0-5 0 - 5 RBC/hpf   WBC, UA 6-30 0 - 5 WBC/hpf   Bacteria, UA  RARE (A) NONE SEEN   Squamous Epithelial / LPF 6-30 (A) NONE SEEN  Pregnancy, urine  Result Value Ref Range   Preg Test, Ur NEGATIVE NEGATIVE  I-stat Chem 8, ED  Result Value Ref Range   Sodium 140 135 - 145 mmol/L   Potassium 4.1 3.5 - 5.1 mmol/L   Chloride 103 101 - 111 mmol/L   BUN 6 6 - 20 mg/dL   Creatinine, Ser 1.610.90 0.44 - 1.00 mg/dL   Glucose, Bld 83 65 - 99 mg/dL   Calcium, Ion 0.961.18 0.451.15 - 1.40 mmol/L   TCO2 26 22 - 32 mmol/L   Hemoglobin 15.0 12.0 - 15.0 g/dL   HCT 40.944.0 81.136.0 - 91.446.0 %  I-stat troponin, ED  Result Value Ref Range   Troponin i, poc 0.00 0.00 - 0.08 ng/mL   Comment 3           Ct Head Wo Contrast Result Date: 11/09/2017 CLINICAL DATA:  Headache, high blood pressure. EXAM: CT HEAD WITHOUT CONTRAST TECHNIQUE: Contiguous axial images were obtained from the base of the skull through the vertex without intravenous contrast. COMPARISON:  None. FINDINGS: Brain: No acute intracranial hemorrhage. No focal mass lesion. No CT evidence of acute infarction. No midline shift or mass effect. No hydrocephalus. Basilar cisterns are patent. Vascular: No hyperdense vessel or  unexpected calcification. Skull: Normal. Negative for fracture or focal lesion. Sinuses/Orbits: Paranasal sinuses and mastoid air cells are clear. Orbits are clear. Other: None. IMPRESSION: Normal head CT Electronically Signed   By: Genevive BiStewart  Edmunds M.D.   On: 11/09/2017 12:56    1500:  CT head obtained within 6 hours of onset of headache and is reassuring. BP 120/80's on my repeat exam. Pt feels better after meds and wants to go home now. Tx headache symptomatically at this time. Outpatient resources for grief will be given; denies SI/HI/AVH. Dx and testing d/w pt.  Questions answered.  Verb understanding, agreeable to d/c home with outpt f/u.       Final Clinical Impressions(s) / ED Diagnoses   Final diagnoses:  None    ED Discharge Orders    None       Samuel JesterMcManus, [place], DO 11/11/17 [id number]

## 2017-11-09 NOTE — Discharge Instructions (Signed)
Take over the counter benadryl, as directed on packaging, with the prescription given to you today, as needed for headache.  Keep a headache diary. Take your usual prescriptions as previously directed. Call your regular medical doctor on Monday to schedule a follow up appointment within the next 3 days.  Return to the Emergency Department immediately sooner if worsening.    Substance Abuse Treatment Programs  Intensive Outpatient Programs [facility]     [address]      [place], [place]                   [phone]       [facility] [address] [place], [place] [phone]  [facility]     (Inpatient and outpatient)     [address]           [phone]    [facility] [phone] (Suboxone and Methadone)  [address]      [place], [place] [address]      [phone]       [address] [place], [place] [phone]  [facility] (Outpatient/Inpatient, [employer])    (insurance only) [phone]             [facility] (Groups & Residential) [place], [place] [phone]     [facility]     [address]     [place], [place]      [phone]       [facility] (for caregivers and family) [phone] [address] [place], [place] [phone]      Residential Treatment Programs [facility]      [address], [place], [place] [address]  [phone]       [facility] [address] [place], [place] [address] [phone]  [facility]        [phone]       [facility] [phone]  [facility] ([facility]             [address]                                         [place], [place]                                                [phone] or [phone]                               [facility] [place] [address] [place], [address] [phone]  [facility]    [address]      [place], [place]     [phone]       [facility] [address] [place], [place] [phone]  [facility]   [address]     [place], [place] [address]     [phone]      Admissions: 8am-3pm M-F  [facility] ([facility]) [address] [place], [place] [phone]  [facility]: Residential Program [phone](90 Days)   [place], [place]      [phone] or [phone]  [facility]: [facility] [place], [place] (Walk in Hours over the weekend or by referral)  [facility] [address] [place], [place], [place] [address] [phone]  Crisis Mobile: [facility]:  [phone] (for crisis response 24 hours a day) [facility] Hotline:      [phone] Outpatient Psychiatry and Counseling  [facility]: Mobile Crisis Management 24 hours:  [phone]  [facility] [address] of [facility] [employer] sliding scale fee and walk in schedule: M-F 8am-12pm/1pm-3pm [address]  [place], [place] [address] [phone]  [facility] [address] [place], [place] [address] [phone]  [facility] ([facility] [employer] new patient walk-in appointments available Monday - Friday 8am -3pm.          [address] [place], [place] [address] [phone] or crisis line- [phone]  [facility] Services/ Intensive Outpatient Therapy Program [address] [place], [place] [address] [phone]  [facility]                  Crisis Services      [phone] [address]     [place], [place] [address]                 [facility]   [facility] [phone]. [address] [place], [place] [address]   [employer] of Care          [address]  [place], [place] [address]       [phone]  [facility] [address] [place], [place] [address] [phone]  [facility]    [address]    [place], [place] [address]     [phone]       Andee Poles, MD     [address]     [place] [address]     [phone]       [facility] [address] [place] [address]  [facility]     [address] [place], [place]      [phone]       [facility] Eulogio Ditch, MD [address] [place], [place] [address] [phone]  [facility]     [address]     [place], [place] [address]     [phone]       [facility] [address] [place], [place] [address] [phone] Resources for Temporary Residential Assistance/Crisis Centers  [facility]) M-F 8am-3pm   [address] [place], [place] [address]   [phone] Services include: laundry, barbering, support groups, case management, phone  & computer access, showers, AA/NA mtgs, mental health/substance abuse nurse, job skills class, disability information, VA assistance, spiritual classes, etc.   HOMELESS SHELTERS  [facility]     [facility]   [address] [place]     [phone]              [employer] (women and children)       [address] [place], [place] [address] [phone] [email or web address] for application and process Application Required  [facility]   [address]    [place] [address]     [phone]  [place], [place] [address] [id number] application appt.) Application Required  [facility] (women only)    [address]     [place], [place] [address]     [phone]      Intake starts 6pm daily Need  valid ID, SSC, & Police report [employer] [address] [place], [place] [id number] Application Required  [employer] (men only)     [place]      [place], [place]       [place] (Pregnant women only) [address] [place], [place]  [facility]      [address]      [place], [place] [address]     [phone]             [facility] [address] [place] [address] [place], [place] 90 day commitment/SA/Application process  [facility](men only)     [address]     [place], [place]       Check-in at [address] [facility] [address] [place], [place] [address] [phone] Men/Women/Women and Children must be there by 7 pm  [place], [place]

## 2020-04-04 ENCOUNTER — Emergency Department (HOSPITAL_COMMUNITY): Payer: Self-pay

## 2020-04-04 ENCOUNTER — Other Ambulatory Visit: Payer: Self-pay

## 2020-04-04 ENCOUNTER — Emergency Department (HOSPITAL_COMMUNITY)
Admission: EM | Admit: 2020-04-04 | Discharge: 2020-04-04 | Disposition: A | Discharge status: Home / Self Care | Payer: Self-pay | Attending: Emergency Medicine | Admitting: Emergency Medicine

## 2020-04-04 ENCOUNTER — Encounter (HOSPITAL_COMMUNITY): Payer: Self-pay | Admitting: Emergency Medicine

## 2020-04-04 DIAGNOSIS — W19XXXA Unspecified fall, initial encounter: Secondary | ICD-10-CM

## 2020-04-04 DIAGNOSIS — I10 Essential (primary) hypertension: Secondary | ICD-10-CM | POA: Insufficient documentation

## 2020-04-04 DIAGNOSIS — S90121A Contusion of right lesser toe(s) without damage to nail, initial encounter: Secondary | ICD-10-CM | POA: Insufficient documentation

## 2020-04-04 DIAGNOSIS — Z79899 Other long term (current) drug therapy: Secondary | ICD-10-CM | POA: Insufficient documentation

## 2020-04-04 DIAGNOSIS — Y999 Unspecified external cause status: Secondary | ICD-10-CM | POA: Insufficient documentation

## 2020-04-04 DIAGNOSIS — W208XXA Other cause of strike by thrown, projected or falling object, initial encounter: Secondary | ICD-10-CM | POA: Insufficient documentation

## 2020-04-04 DIAGNOSIS — F17210 Nicotine dependence, cigarettes, uncomplicated: Secondary | ICD-10-CM | POA: Insufficient documentation

## 2020-04-04 DIAGNOSIS — Y929 Unspecified place or not applicable: Secondary | ICD-10-CM | POA: Insufficient documentation

## 2020-04-04 DIAGNOSIS — Y939 Activity, unspecified: Secondary | ICD-10-CM | POA: Insufficient documentation

## 2020-04-04 DIAGNOSIS — S90122A Contusion of left lesser toe(s) without damage to nail, initial encounter: Secondary | ICD-10-CM

## 2020-04-04 NOTE — Discharge Instructions (Signed)
Return if any problems.

## 2020-04-04 NOTE — ED Provider Notes (Signed)
[facility] EMERGENCY DEPARTMENT Provider Note   CSN: [id number] Arrival date & time: 04/04/20  1947     History Chief Complaint  Patient presents with  . Foot Pain    Katherine Ward is a 44 y.o. female.  The history is provided by the patient. No language interpreter was used.  Foot Pain This is a new problem. The current episode started yesterday. The problem occurs constantly. The problem has not changed since onset.Nothing aggravates the symptoms. She has tried nothing for the symptoms. The treatment provided no relief.  Pt reports she was moving a refrigerator and it fell on her foot.  Pt complains of pain in her little toe      Past Medical History:  Diagnosis Date  . Hypertension     Patient Active Problem List   Diagnosis Date Noted  . Essential hypertension 12/17/2014  . MDD (major depressive disorder), recurrent episode, severe (HCC) 12/16/2014  . Suicidal thoughts 04/09/2014    Past Surgical History:  Procedure Laterality Date  . CESAREAN SECTION    . HERNIA REPAIR       OB History    Gravida  3   Para  3   Term  3   Preterm      AB      Living        SAB      TAB      Ectopic      Multiple      Live Births              Family History  Problem Relation Age of Onset  . Hypertension Mother   . Hypertension Father   . Bipolar disorder Paternal Aunt     Social History   Tobacco Use  . Smoking status: Current Every Day Smoker    Packs/day: 0.10    Years: 12.00    Pack years: 1.20    Types: Cigarettes  . Smokeless tobacco: Never Used  Vaping Use  . Vaping Use: Never used  Substance Use Topics  . Alcohol use: No  . Drug use: No    Types: Marijuana    Comment: denies    Home Medications Prior to Admission medications   Medication Sig Start Date End Date Taking? Authorizing Provider  amLODipine (NORVASC) 5 MG tablet Take 1 tablet (5 mg total) by mouth daily. 10/13/15   Vanetta Mulders, MD  bacitracin ointment Apply 1  application topically 2 (two) times daily. 10/28/17   Eber Hong, MD  lisinopril-hydrochlorothiazide (ZESTORETIC) 20-12.5 MG tablet Take 1 tablet by mouth daily. 10/28/17   Eber Hong, MD  metoCLOPramide (REGLAN) 10 MG tablet Take 1 tablet (10 mg total) by mouth every 6 (six) hours as needed for nausea (or headache). 11/09/17   Samuel Jester, DO    Allergies    Acetaminophen  Review of Systems   Review of Systems  All other systems reviewed and are negative.   Physical Exam Updated Vital Signs BP (!) 175/109 (BP Location: Right Arm)   Pulse (!) 109   Temp 98.1 F (36.7 C) (Oral)   Resp 16   Ht 5\' 4"  (1.626 m)   Wt 66.2 kg   SpO2 100%   BMI 25.06 kg/m   Physical Exam Vitals reviewed.  Constitutional:      Appearance: Normal appearance.  Musculoskeletal:        General: Swelling and tenderness present.     Comments: Tender 5th toe left foot, nv and ns  intact  Skin:    General: Skin is warm.  Neurological:     General: No focal deficit present.     Mental Status: She is alert.  Psychiatric:        Mood and Affect: Mood normal.     ED Results / Procedures / Treatments   Labs (all labs ordered are listed, but only abnormal results are displayed) Labs Reviewed - No data to display  EKG None  Radiology DG Foot Complete Right  Result Date: 04/04/2020 CLINICAL DATA:  Fifth toe pain following blunt trauma, initial encounter EXAM: RIGHT FOOT COMPLETE - 3+ VIEW COMPARISON:  None. FINDINGS: There is no evidence of fracture or dislocation. There is no evidence of arthropathy or other focal bone abnormality. Soft tissues are unremarkable. IMPRESSION: No acute abnormality noted. Electronically Signed   By: Alcide Clever M.D.   On: 04/04/2020 20:34    Procedures Procedures (including critical care time)  Medications Ordered in ED Medications - No data to display  ED Course  I have reviewed the triage vital signs and the nursing notes.  Pertinent labs & imaging  results that were available during my care of the patient were reviewed by me and considered in my medical decision making (see chart for details).    MDM Rules/Calculators/A&P                          MDM:  Xray no fracture. Pt advised ice and tylenol  Final Clinical Impression(s) / ED Diagnoses Final diagnoses:  Fall  Contusion of fifth toe of left foot, initial encounter    Rx / DC Orders ED Discharge Orders    None    An After Visit Summary was printed and given to the patient.    Osie Cheeks 04/04/20 2218    Vanetta Mulders, MD 04/06/20 [phone]

## 2020-04-04 NOTE — ED Notes (Signed)
Pt states she has htn and she hasnt taken her bp meds today.

## 2020-04-04 NOTE — ED Triage Notes (Signed)
Pt states she was moving a washing machine yesterday and it landed on her right foot.

## 2020-06-07 ENCOUNTER — Encounter (HOSPITAL_COMMUNITY): Payer: Self-pay

## 2020-06-07 ENCOUNTER — Other Ambulatory Visit: Payer: Self-pay

## 2020-06-07 ENCOUNTER — Emergency Department (HOSPITAL_COMMUNITY): Payer: Self-pay

## 2020-06-07 ENCOUNTER — Emergency Department (HOSPITAL_COMMUNITY)
Admission: EM | Admit: 2020-06-07 | Discharge: 2020-06-07 | Disposition: A | Discharge status: Home / Self Care | Payer: Self-pay | Attending: Emergency Medicine | Admitting: Emergency Medicine

## 2020-06-07 DIAGNOSIS — Z79899 Other long term (current) drug therapy: Secondary | ICD-10-CM | POA: Insufficient documentation

## 2020-06-07 DIAGNOSIS — I10 Essential (primary) hypertension: Secondary | ICD-10-CM | POA: Insufficient documentation

## 2020-06-07 DIAGNOSIS — R519 Headache, unspecified: Secondary | ICD-10-CM | POA: Insufficient documentation

## 2020-06-07 DIAGNOSIS — M7918 Myalgia, other site: Secondary | ICD-10-CM | POA: Insufficient documentation

## 2020-06-07 DIAGNOSIS — F17210 Nicotine dependence, cigarettes, uncomplicated: Secondary | ICD-10-CM | POA: Insufficient documentation

## 2020-06-07 DIAGNOSIS — R112 Nausea with vomiting, unspecified: Secondary | ICD-10-CM | POA: Insufficient documentation

## 2020-06-07 DIAGNOSIS — Z20822 Contact with and (suspected) exposure to covid-19: Secondary | ICD-10-CM | POA: Insufficient documentation

## 2020-06-07 LAB — BASIC METABOLIC PANEL
Anion gap: 8 (ref 5–15)
BUN: 8 mg/dL (ref 6–20)
CO2: 26 mmol/L (ref 22–32)
Calcium: 9.5 mg/dL (ref 8.9–10.3)
Chloride: 105 mmol/L (ref 98–111)
Creatinine, Ser: 0.83 mg/dL (ref 0.44–1.00)
GFR calc Af Amer: >60 mL/min (ref >60)
GFR calc non Af Amer: >60 mL/min (ref >60)
Glucose, Bld: 87 mg/dL (ref 70–99)
Potassium: 4.5 mmol/L (ref 3.5–5.1)
Sodium: 139 mmol/L (ref 135–145)

## 2020-06-07 LAB — CBC WITH DIFFERENTIAL/PLATELET
Abs Immature Granulocytes: 0.01 10*3/uL (ref 0.00–0.07)
Basophils Absolute: 0 10*3/uL (ref 0.0–0.1)
Basophils Relative: 0 %
Eosinophils Absolute: 0.1 10*3/uL (ref 0.0–0.5)
Eosinophils Relative: 1 %
HCT: 41.9 % (ref 36.0–46.0)
Hemoglobin: 13.2 g/dL (ref 12.0–15.0)
Immature Granulocytes: 0 %
Lymphocytes Relative: 31 %
Lymphs Abs: 2.2 10*3/uL (ref 0.7–4.0)
MCH: 24.7 pg — ABNORMAL LOW (ref 26.0–34.0)
MCHC: 31.5 g/dL (ref 30.0–36.0)
MCV: 78.5 fL — ABNORMAL LOW (ref 80.0–100.0)
Monocytes Absolute: 0.6 10*3/uL (ref 0.1–1.0)
Monocytes Relative: 9 %
Neutro Abs: 4.2 10*3/uL (ref 1.7–7.7)
Neutrophils Relative %: 59 %
Platelets: 208 10*3/uL (ref 150–400)
RBC: 5.34 MIL/uL — ABNORMAL HIGH (ref 3.87–5.11)
RDW: 14.3 % (ref 11.5–15.5)
WBC: 7.1 10*3/uL (ref 4.0–10.5)
nRBC: 0 % (ref 0.0–0.2)

## 2020-06-07 LAB — SARS CORONAVIRUS 2 BY RT PCR (HOSPITAL ORDER, PERFORMED IN ~~LOC~~ HOSPITAL LAB): SARS Coronavirus 2: NEGATIVE

## 2020-06-07 LAB — I-STAT BETA HCG BLOOD, ED (MC, WL, AP ONLY): I-stat hCG, quantitative: <5 m[IU]/mL (ref <5)

## 2020-06-07 MED ORDER — AMLODIPINE BESYLATE 5 MG PO TABS
5.0000 mg | ORAL_TABLET | Freq: Every day | ORAL | Status: DC
  Administered 2020-06-07: 5 mg via ORAL
  Filled 2020-06-07: qty 1

## 2020-06-07 MED ORDER — SODIUM CHLORIDE 0.9 % IV BOLUS
1000.0000 mL | Freq: Once | INTRAVENOUS | Status: AC
  Administered 2020-06-07: 1000 mL via INTRAVENOUS

## 2020-06-07 MED ORDER — KETOROLAC TROMETHAMINE 15 MG/ML IJ SOLN
15.0000 mg | Freq: Once | INTRAMUSCULAR | Status: AC
  Administered 2020-06-07: 15 mg via INTRAVENOUS
  Filled 2020-06-07: qty 1

## 2020-06-07 MED ORDER — LISINOPRIL 10 MG PO TABS
20.0000 mg | ORAL_TABLET | Freq: Every day | ORAL | Status: DC
  Administered 2020-06-07: 20 mg via ORAL
  Filled 2020-06-07: qty 2

## 2020-06-07 MED ORDER — HYDROCHLOROTHIAZIDE 12.5 MG PO CAPS
12.5000 mg | ORAL_CAPSULE | Freq: Every day | ORAL | Status: DC
  Administered 2020-06-07: 12.5 mg via ORAL
  Filled 2020-06-07: qty 1

## 2020-06-07 MED ORDER — LISINOPRIL-HYDROCHLOROTHIAZIDE 20-12.5 MG PO TABS: 1.0000 | ORAL_TABLET | Freq: Every day | ORAL | 0 refills | Status: DC

## 2020-06-07 MED ORDER — ONDANSETRON HCL 4 MG/2ML IJ SOLN
4.0000 mg | Freq: Once | INTRAMUSCULAR | Status: AC
  Administered 2020-06-07: 4 mg via INTRAVENOUS
  Filled 2020-06-07: qty 2

## 2020-06-07 MED ORDER — AMLODIPINE BESYLATE 5 MG PO TABS: 5.0000 mg | ORAL_TABLET | Freq: Every day | ORAL | 0 refills | Status: DC

## 2020-06-07 MED ORDER — PROCHLORPERAZINE EDISYLATE 10 MG/2ML IJ SOLN
10.0000 mg | Freq: Once | INTRAMUSCULAR | Status: AC
  Administered 2020-06-07: 10 mg via INTRAVENOUS
  Filled 2020-06-07: qty 2

## 2020-06-07 MED ORDER — DIPHENHYDRAMINE HCL 50 MG/ML IJ SOLN
50.0000 mg | Freq: Once | INTRAMUSCULAR | Status: AC
  Administered 2020-06-07: 50 mg via INTRAVENOUS
  Filled 2020-06-07: qty 1

## 2020-06-07 MED ORDER — LISINOPRIL-HYDROCHLOROTHIAZIDE 20-12.5 MG PO TABS: 1.0000 | ORAL_TABLET | Freq: Every day | ORAL | Status: DC

## 2020-06-07 NOTE — Discharge Instructions (Signed)
Please pick up blood pressure medications and take as prescribed. It is very important to take them every day so that your blood pressure can be better managed. This will also help prevent headaches.   Follow up with your PCP regarding your ED visit today

## 2020-06-07 NOTE — ED Provider Notes (Signed)
[facility] EMERGENCY DEPARTMENT Provider Note   CSN: [id number] Arrival date & time: 06/07/20  8366     History Chief Complaint  Patient presents with  . Headache    Katherine Ward is a 44 y.o. female with PMHx HTN who presents to the ED today with complaint of gradual onset, constant, frontal headache x 4 days. Pt reports she recently ran out of her BP medications 4 days ago and she believes this is what caused her headache. She is also complains of nausea, NBNB emesis, and body aches. Pt states she does not typically have these other symptoms with her headaches. She has been taking Advil and Goody powders at home without relief. She does mention that her boyfriend is having diarrhea as well but she did not think much of it. She is not having any diarrhea. She is not vaccinated against COVID 19. Pt denies fevers, chills, cough, shortness of breath, chest pain, abdominal pain, constipation, urinary sx, vaginal discharge, pelvic pain, or any other associated symptoms. LNMP 08/25.   The history is provided by the patient and medical records.       Past Medical History:  Diagnosis Date  . Hypertension     Patient Active Problem List   Diagnosis Date Noted  . Essential hypertension 12/17/2014  . MDD (major depressive disorder), recurrent episode, severe (HCC) 12/16/2014  . Suicidal thoughts 04/09/2014    Past Surgical History:  Procedure Laterality Date  . CESAREAN SECTION    . HERNIA REPAIR       OB History    Gravida  3   Para  3   Term  3   Preterm      AB      Living        SAB      TAB      Ectopic      Multiple      Live Births              Family History  Problem Relation Age of Onset  . Hypertension Mother   . Hypertension Father   . Bipolar disorder Paternal Aunt     Social History   Tobacco Use  . Smoking status: Current Every Day Smoker    Packs/day: 0.10    Years: 12.00    Pack years: 1.20    Types: Cigarettes  . Smokeless  tobacco: Never Used  Vaping Use  . Vaping Use: Never used  Substance Use Topics  . Alcohol use: No  . Drug use: No    Types: Marijuana    Comment: denies    Home Medications Prior to Admission medications   Medication Sig Start Date End Date Taking? Authorizing Provider  amLODipine (NORVASC) 5 MG tablet Take 1 tablet (5 mg total) by mouth daily. Patient taking differently: Take 5 mg by mouth every other day.  10/13/15  Yes Vanetta Mulders, MD  amLODipine (NORVASC) 5 MG tablet Take 1 tablet (5 mg total) by mouth daily. 06/07/20 07/07/20  Hyman Hopes, Meerab Maselli, PA-C  bacitracin ointment Apply 1 application topically 2 (two) times daily. Patient not taking: Reported on 06/07/2020 10/28/17   Eber Hong, MD  lisinopril-hydrochlorothiazide (ZESTORETIC) 20-12.5 MG tablet Take 1 tablet by mouth daily. 06/07/20 07/07/20  Tanda Rockers, PA-C  metoCLOPramide (REGLAN) 10 MG tablet Take 1 tablet (10 mg total) by mouth every 6 (six) hours as needed for nausea (or headache). Patient not taking: Reported on 06/07/2020 11/09/17   Samuel Jester, DO  Allergies    Acetaminophen  Review of Systems   Review of Systems  Constitutional: Negative for chills and fever.  Eyes: Negative for visual disturbance.  Respiratory: Negative for cough and shortness of breath.   Cardiovascular: Negative for chest pain.  Gastrointestinal: Positive for nausea and vomiting. Negative for abdominal pain, constipation and diarrhea.  Musculoskeletal: Positive for myalgias. Negative for neck pain and neck stiffness.  Skin: Negative for rash.  Neurological: Positive for headaches. Negative for syncope, speech difficulty, weakness and numbness.  All other systems reviewed and are negative.   Physical Exam Updated Vital Signs BP (!) 191/119   Pulse 68   Temp 98.2 F (36.8 C) (Oral)   Resp 18   Ht 5\' 4"  (1.626 m)   Wt 66.7 kg   LMP 05/25/2020   SpO2 98%   BMI 25.23 kg/m   Physical Exam Vitals and nursing note reviewed.    Constitutional:      Appearance: She is ill-appearing. She is not diaphoretic.  HENT:     Head: Normocephalic and atraumatic.  Eyes:     Extraocular Movements: Extraocular movements intact.     Conjunctiva/sclera: Conjunctivae normal.     Pupils: Pupils are equal, round, and reactive to light.  Neck:     Meningeal: Brudzinski's sign and Kernig's sign absent.  Cardiovascular:     Rate and Rhythm: Normal rate and regular rhythm.     Heart sounds: Normal heart sounds.  Pulmonary:     Effort: Pulmonary effort is normal.     Breath sounds: Normal breath sounds. No wheezing, rhonchi or rales.  Abdominal:     Palpations: Abdomen is soft.     Tenderness: There is no abdominal tenderness. There is no guarding.  Musculoskeletal:     Cervical back: Neck supple.  Skin:    General: Skin is warm and dry.  Neurological:     Mental Status: She is alert.     Comments: CN 3-12 grossly intact A&O x4 GCS 15 Sensation and strength intact Gait nonataxic including with tandem walking Coordination with finger-to-nose WNL Neg romberg, neg pronator drift     ED Results / Procedures / Treatments   Labs (all labs ordered are listed, but only abnormal results are displayed) Labs Reviewed  CBC WITH DIFFERENTIAL/PLATELET - Abnormal; Notable for the following components:      Result Value   RBC 5.34 (*)    MCV 78.5 (*)    MCH 24.7 (*)    All other components within normal limits  SARS CORONAVIRUS 2 BY RT PCR (HOSPITAL ORDER, PERFORMED IN [facility])  BASIC METABOLIC PANEL  I-STAT BETA HCG BLOOD, ED ([facility], [facility], [facility] ONLY)    EKG None  Radiology CT Head Wo Contrast  Result Date: 06/07/2020 CLINICAL DATA:  Headache and elevated blood pressure. Frontal headache for days. Nausea and vomiting. EXAM: CT HEAD WITHOUT CONTRAST TECHNIQUE: Contiguous axial images were obtained from the base of the skull through the vertex without intravenous contrast. COMPARISON:  11/09/2017 FINDINGS: Brain:  No intracranial hemorrhage, mass effect, or midline shift. No hydrocephalus. The basilar cisterns are patent. No evidence of territorial infarct or acute ischemia. No extra-axial or intracranial fluid collection. Vascular: No hyperdense vessel or unexpected calcification. Skull: Normal. Negative for fracture or focal lesion. Sinuses/Orbits: Paranasal sinuses and mastoid air cells are clear. The visualized orbits are unremarkable. Other: None. IMPRESSION: Negative noncontrast head CT. Electronically Signed   By: 01/07/2018 M.D.   On: 06/07/2020 16:39  Procedures Procedures (including critical care time)  Medications Ordered in ED Medications  amLODipine (NORVASC) tablet 5 mg (5 mg Oral Given 06/07/20 1401)  lisinopril (ZESTRIL) tablet 20 mg (20 mg Oral Given 06/07/20 1401)    And  hydrochlorothiazide (MICROZIDE) capsule 12.5 mg (12.5 mg Oral Given 06/07/20 1401)  ondansetron (ZOFRAN) injection 4 mg (4 mg Intravenous Given 06/07/20 1257)  prochlorperazine (COMPAZINE) injection 10 mg (10 mg Intravenous Given 06/07/20 1403)  diphenhydrAMINE (BENADRYL) injection 50 mg (50 mg Intravenous Given 06/07/20 1403)  ketorolac (TORADOL) 15 MG/ML injection 15 mg (15 mg Intravenous Given 06/07/20 1403)  sodium chloride 0.9 % bolus 1,000 mL (1,000 mLs Intravenous New Bag/Given 06/07/20 1430)    ED Course  I have reviewed the triage vital signs and the nursing notes.  Pertinent labs & imaging results that were available during my care of the patient were reviewed by me and considered in my medical decision making (see chart for details).    MDM Rules/Calculators/A&P                          44 year old female presents to the ED today complaining of a headache for the past several days.  Has been out of her blood pressure medications as well.  She is however having body aches, nausea, vomiting.  She is not vaccinated against COVID-19.  Her boyfriend is having diarrhea.  On arrival to the ED patient is afebrile,  nontachycardic nontachypneic.  She does appear ill appearing.  Blood pressure is 191/119.  Has no focal neuro deficits on exam today.  No meningeal signs.  Will plan to restart blood pressure medication.  Will obtain lab work at this time.  Will swab for Covid.  We will plan for headache cocktail after preg test has returned negative.   Preg test negative CBC without leukocytosis. Hgb stable at 13.2 BMP without electrolyte abnormalities. Creatinine stable at 0.83.  COVID negative  Repeat BP after meds 188/103. ED visit 2 months ago with similar reading. Difficult to say if pt is being compliant with meds.  On reevaluation pt's headache has completely resolved. She is found laying comfortably in bed sleeping; easily arousable. Will discharge at this time with PCP follow up. Pt stable for discharge at this time  This note was prepared using Dragon voice recognition software and may include unintentional dictation errors due to the inherent limitations of voice recognition software.    Final Clinical Impression(s) / ED Diagnoses Final diagnoses:  Essential hypertension  Bad headache    Rx / DC Orders ED Discharge Orders         Ordered    amLODipine (NORVASC) 5 MG tablet  Daily        06/07/20 1734    lisinopril-hydrochlorothiazide (ZESTORETIC) 20-12.5 MG tablet  Daily        06/07/20 1734           Discharge Instructions     Please pick up blood pressure medications and take as prescribed. It is very important to take them every day so that your blood pressure can be better managed. This will also help prevent headaches.   Follow up with your PCP regarding your ED visit today         Milinda Antis 06/07/20 1736    Raeford Razor, MD 06/08/20 [phone]

## 2020-06-07 NOTE — ED Notes (Signed)
Transport to CT

## 2020-06-07 NOTE — ED Triage Notes (Addendum)
Pt presents to ED with complaints of frontal headache started Saturday. Pt states also nauseated and vomiting. Pt states she ran out of her BP medications

## 2020-06-09 ENCOUNTER — Telehealth (HOSPITAL_COMMUNITY): Payer: Self-pay | Admitting: Emergency Medicine

## 2020-06-09 MED ORDER — LISINOPRIL-HYDROCHLOROTHIAZIDE 20-12.5 MG PO TABS: 1.0000 | ORAL_TABLET | Freq: Every day | ORAL | 0 refills | Status: DC

## 2020-06-09 NOTE — Telephone Encounter (Signed)
Prescription had been sent with the wrong patient.  Now sent to pharmacy electronically.

## 2020-10-03 ENCOUNTER — Emergency Department (HOSPITAL_COMMUNITY): Admission: EM | Admit: 2020-10-03 | Discharge: 2020-10-03 | Discharge status: Against Medical Advice | Payer: Self-pay

## 2020-10-03 ENCOUNTER — Emergency Department (HOSPITAL_COMMUNITY)
Admission: EM | Admit: 2020-10-03 | Discharge: 2020-10-03 | Disposition: A | Discharge status: Home / Self Care | Payer: HRSA Program | Attending: Emergency Medicine | Admitting: Emergency Medicine

## 2020-10-03 ENCOUNTER — Encounter (HOSPITAL_COMMUNITY): Payer: Self-pay | Admitting: Emergency Medicine

## 2020-10-03 ENCOUNTER — Other Ambulatory Visit: Payer: Self-pay

## 2020-10-03 DIAGNOSIS — U071 COVID-19: Secondary | ICD-10-CM | POA: Diagnosis not present

## 2020-10-03 DIAGNOSIS — R059 Cough, unspecified: Secondary | ICD-10-CM | POA: Diagnosis present

## 2020-10-03 DIAGNOSIS — Z79899 Other long term (current) drug therapy: Secondary | ICD-10-CM | POA: Diagnosis not present

## 2020-10-03 DIAGNOSIS — F17210 Nicotine dependence, cigarettes, uncomplicated: Secondary | ICD-10-CM | POA: Insufficient documentation

## 2020-10-03 DIAGNOSIS — B349 Viral infection, unspecified: Secondary | ICD-10-CM

## 2020-10-03 DIAGNOSIS — I10 Essential (primary) hypertension: Secondary | ICD-10-CM | POA: Insufficient documentation

## 2020-10-03 MED ORDER — BENZONATATE 200 MG PO CAPS: 200.0000 mg | ORAL_CAPSULE | Freq: Three times a day (TID) | ORAL | 0 refills | Status: DC | PRN

## 2020-10-03 NOTE — ED Triage Notes (Addendum)
Pt c/o generalized body aches, unable to taste or smell. Pt states she was around her daughter who has covid

## 2020-10-03 NOTE — Discharge Instructions (Signed)
Your Covid test results are pending.  Your results should be back in 6 to 24 hours.  You may review your results on the MyChart app.  Continue to isolate at home for at least 5 days.  If you are feeling better by day 5 and a walker have symptoms you may return to your regular activities but must wear a mask for 5 additional days.  If you are still symptomatic, you will need to continue to isolate for 5 more days.

## 2020-10-03 NOTE — ED Provider Notes (Signed)
[facility] EMERGENCY DEPARTMENT Provider Note   CSN: [id number] Arrival date & time: 10/03/20  1614     History Chief Complaint  Patient presents with  . Generalized Body Aches    SHIESHA JAHN is a 46 y.o. female.  HPI      LOWELL MCGURK is a 45 y.o. female who presents to the Emergency Department complaining of generalized body aches and weakness, loss of taste or smell and occasional cough.  Symptoms have been present for 3-4 days.  States that she had an exposure to covid last week from her daughter who recently tested positive.  She states that she has been quarantined to her home since last week and tried to return to work today, but felt too weak which prompted her to come here for covid testing.  No known fever at home. She denies abdominal or chest pain, shortness of breath, sore throat, vomiting or diarrhea.  She is unvaccinated for covid 19.   Past Medical History:  Diagnosis Date  . Hypertension     Patient Active Problem List   Diagnosis Date Noted  . Essential hypertension 12/17/2014  . MDD (major depressive disorder), recurrent episode, severe (HCC) 12/16/2014  . Suicidal thoughts 04/09/2014    Past Surgical History:  Procedure Laterality Date  . CESAREAN SECTION    . HERNIA REPAIR       OB History    Gravida  3   Para  3   Term  3   Preterm      AB      Living        SAB      IAB      Ectopic      Multiple      Live Births              Family History  Problem Relation Age of Onset  . Hypertension Mother   . Hypertension Father   . Bipolar disorder Paternal Aunt     Social History   Tobacco Use  . Smoking status: Current Every Day Smoker    Packs/day: 0.10    Years: 12.00    Pack years: 1.20    Types: Cigarettes  . Smokeless tobacco: Never Used  Vaping Use  . Vaping Use: Never used  Substance Use Topics  . Alcohol use: No  . Drug use: No    Types: Marijuana    Comment: denies    Home Medications Prior to  Admission medications   Medication Sig Start Date End Date Taking? Authorizing Provider  benzonatate (TESSALON) 200 MG capsule Take 1 capsule (200 mg total) by mouth 3 (three) times daily as needed for cough. Swallow whole, do not chew 10/03/20  Yes Elea Holtzclaw, PA-C  amLODipine (NORVASC) 5 MG tablet Take 1 tablet (5 mg total) by mouth daily. Patient taking differently: Take 5 mg by mouth every other day.  10/13/15   Vanetta Mulders, MD  amLODipine (NORVASC) 5 MG tablet Take 1 tablet (5 mg total) by mouth daily. 06/07/20 07/07/20  Hyman Hopes, Margaux, PA-C  bacitracin ointment Apply 1 application topically 2 (two) times daily. Patient not taking: Reported on 06/07/2020 10/28/17   Eber Hong, MD  lisinopril-hydrochlorothiazide (ZESTORETIC) 20-12.5 MG tablet Take 1 tablet by mouth daily. 06/09/20   Benjiman Core, MD  metoCLOPramide (REGLAN) 10 MG tablet Take 1 tablet (10 mg total) by mouth every 6 (six) hours as needed for nausea (or headache). Patient not taking: Reported on 06/07/2020 11/09/17  Samuel Jester, DO    Allergies    Acetaminophen  Review of Systems   Review of Systems  Constitutional: Positive for fatigue. Negative for appetite change, chills and fever.  HENT: Positive for congestion. Negative for rhinorrhea, sore throat and trouble swallowing.   Respiratory: Positive for cough. Negative for chest tightness, shortness of breath and wheezing.   Cardiovascular: Negative for chest pain.  Gastrointestinal: Negative for abdominal pain, diarrhea, nausea and vomiting.  Genitourinary: Negative for decreased urine volume and dysuria.  Musculoskeletal: Positive for myalgias. Negative for arthralgias.  Skin: Negative for rash.  Neurological: Negative for dizziness, syncope, weakness, numbness and headaches.  Hematological: Negative for adenopathy.  Psychiatric/Behavioral: Negative for confusion.    Physical Exam Updated Vital Signs BP (!) 159/102   Pulse 72   Temp 98.1 F (36.7 C)  (Oral)   Resp (!) 23   Ht 5\' 4"  (1.626 m)   Wt 65.8 kg   SpO2 100%   BMI 24.89 kg/m   Physical Exam Vitals and nursing note reviewed.  Constitutional:      General: She is not in acute distress.    Appearance: Normal appearance. She is not toxic-appearing.  Cardiovascular:     Rate and Rhythm: Normal rate and regular rhythm.     Pulses: Normal pulses.  Pulmonary:     Effort: Pulmonary effort is normal. No respiratory distress.     Breath sounds: Normal breath sounds. No wheezing.  Abdominal:     Palpations: Abdomen is soft.     Tenderness: There is no abdominal tenderness.  Musculoskeletal:     Cervical back: Normal range of motion. No rigidity or tenderness.     Right lower leg: No edema.     Left lower leg: No edema.  Skin:    General: Skin is warm.     Capillary Refill: Capillary refill takes less than 2 seconds.     Findings: No rash.  Neurological:     General: No focal deficit present.     Mental Status: She is alert.     Sensory: No sensory deficit.     Motor: No weakness.     ED Results / Procedures / Treatments   Labs (all labs ordered are listed, but only abnormal results are displayed) Labs Reviewed  SARS CORONAVIRUS 2 (TAT 6-24 HRS)    EKG None  Radiology No results found.  Procedures Procedures (including critical care time)  Medications Ordered in ED Medications - No data to display  ED Course  I have reviewed the triage vital signs and the nursing notes.  Pertinent labs & imaging results that were available during my care of the patient were reviewed by me and considered in my medical decision making (see chart for details).    MDM Rules/Calculators/A&P                          Pt here with symptoms highly suggestive of covid infection.  Known covid exposure.  Pt is unvaccinated. PCR test pending.  She is non toxic appearing.  No hypoxia or tachycardia.  Pt is hypertensive w/o associated symptoms.  Discussed medication compliance.  Pt  appears appropriate for d/c home, understands that she will be notified of positive results, I have recommended that she review her results via Mychart.  Isolation guidelines discussed as well as return precautions.  Aliegha Paullin Drumheller was evaluated in Emergency Department on 10/05/2020 for the symptoms described in the history of present  illness. She was evaluated in the context of the global COVID-19 pandemic, which necessitated consideration that the patient might be at risk for infection with the SARS-CoV-2 virus that causes COVID-19. Institutional protocols and algorithms that pertain to the evaluation of patients at risk for COVID-19 are in a state of rapid change based on information released by regulatory bodies including the CDC and federal and state organizations. These policies and algorithms were followed during the patient's care in the ED.   Final Clinical Impression(s) / ED Diagnoses Final diagnoses:  Viral illness    Rx / DC Orders ED Discharge Orders         Ordered    benzonatate (TESSALON) 200 MG capsule  3 times daily PRN        10/03/20 1701           Kem Parkinson, PA-C 10/05/20 1025    Sherwood Gambler, MD 10/06/20 [phone]

## 2020-10-04 LAB — SARS CORONAVIRUS 2 (TAT 6-24 HRS): SARS Coronavirus 2: POSITIVE — AB

## 2021-03-16 IMAGING — CT CT HEAD W/O CM
3 series · 16 of 47 positions shown, 19 images · non-contrast
Comparison: 11/09/2017

CLINICAL DATA: Headache and elevated blood pressure. Frontal
headache for days. Nausea and vomiting.

EXAM:
CT HEAD WITHOUT CONTRAST
TECHNIQUE: Contiguous axial images were obtained from the base of the skull
through the vertex without intravenous contrast.

[Series 2: head w o · axial · 0.40mm/px · z∈[+1420,+1545]mm · 10 of 30 slices shown, 13 images]
[im 3/30  brain]
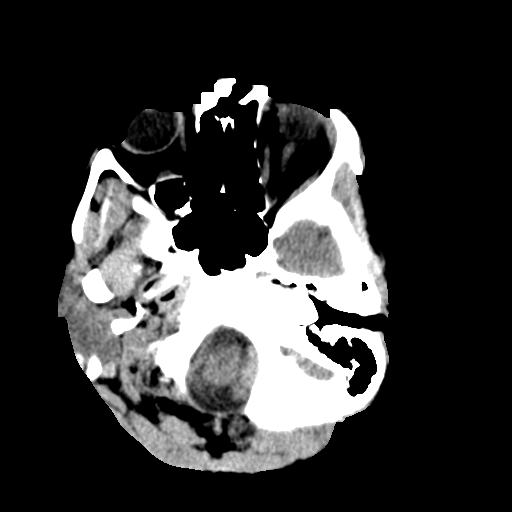
[im 3/30  bone]
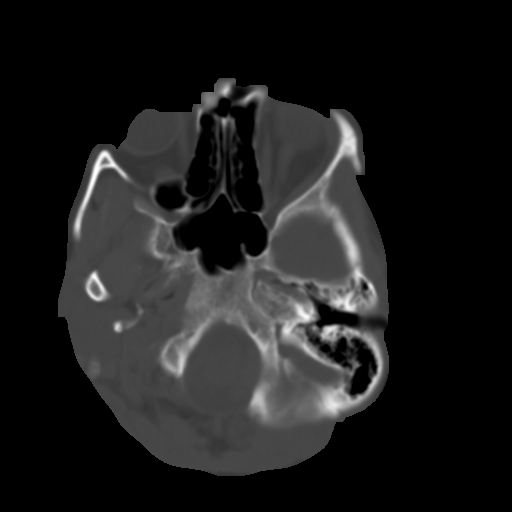
[im 6/30  brain]
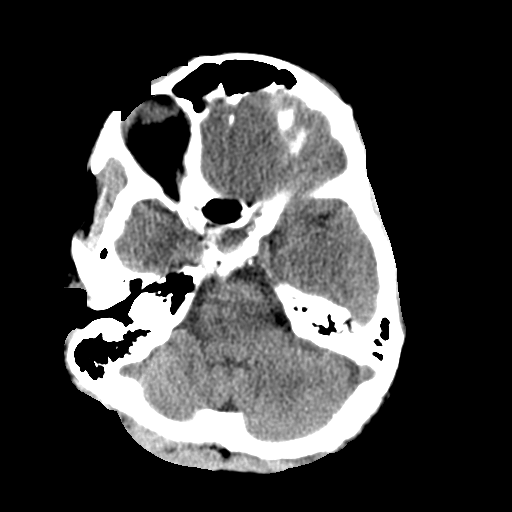
[im 9/30  brain]
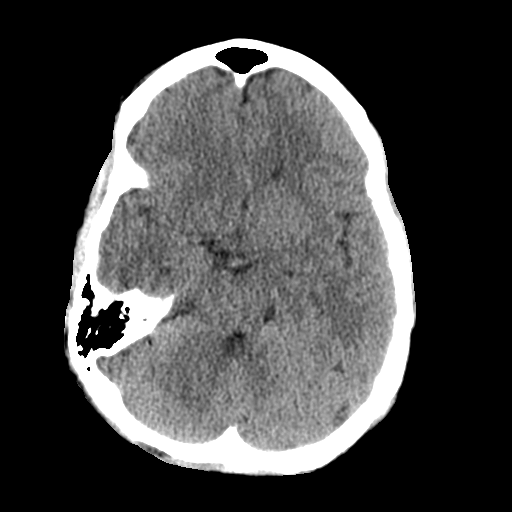
[im 11/30  brain]
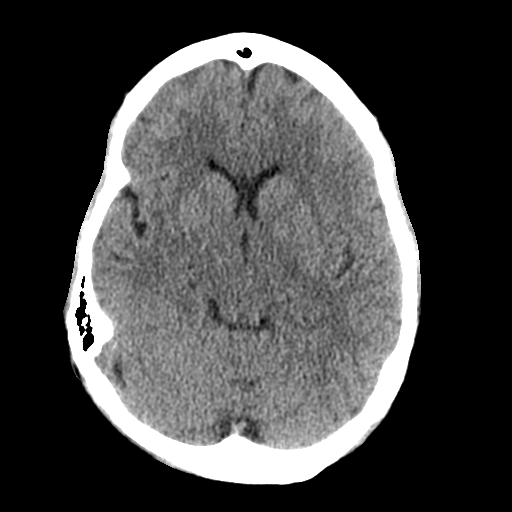
[im 14/30  brain]
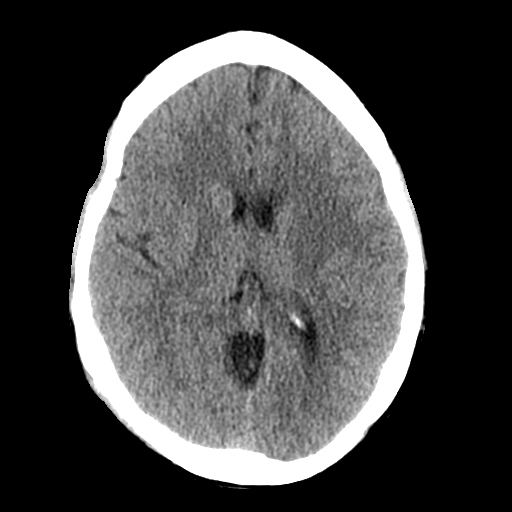
[im 14/30  bone]
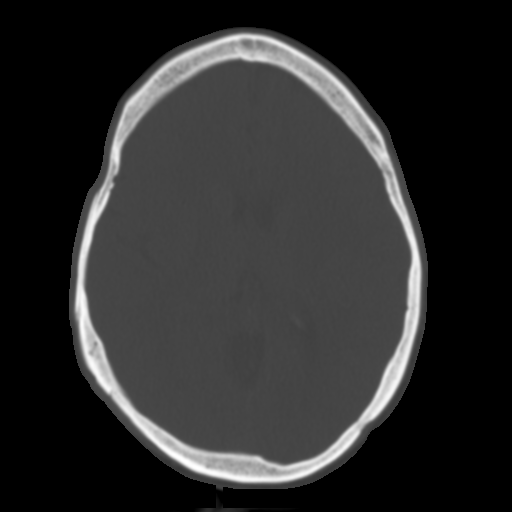
[im 17/30  brain]
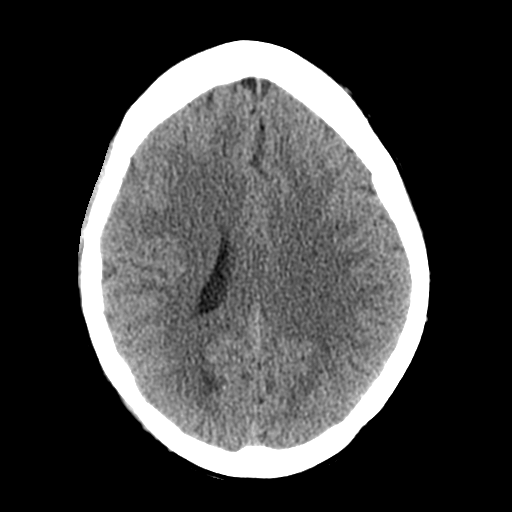
[im 20/30  brain]
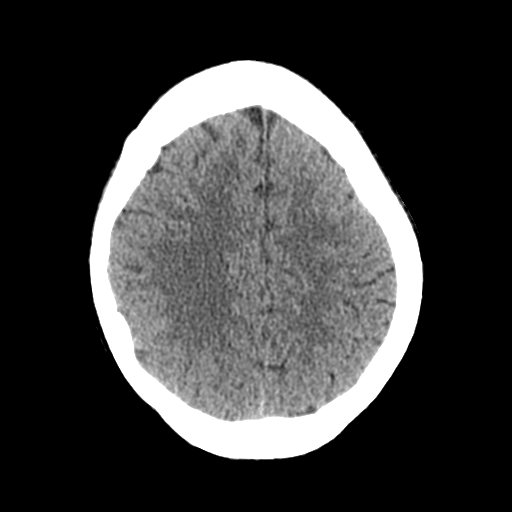
[im 23/30  brain]
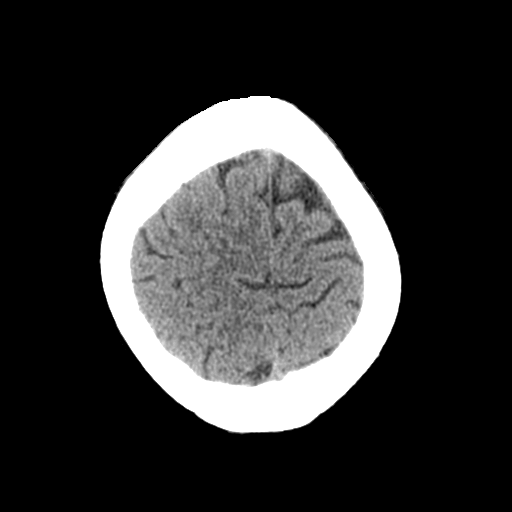
[im 25/30  brain]
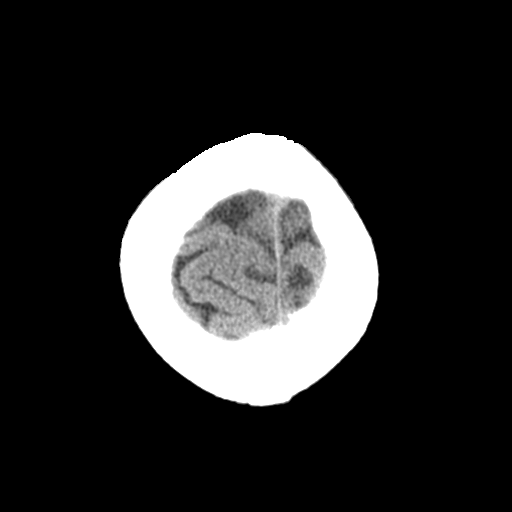
[im 25/30  bone]
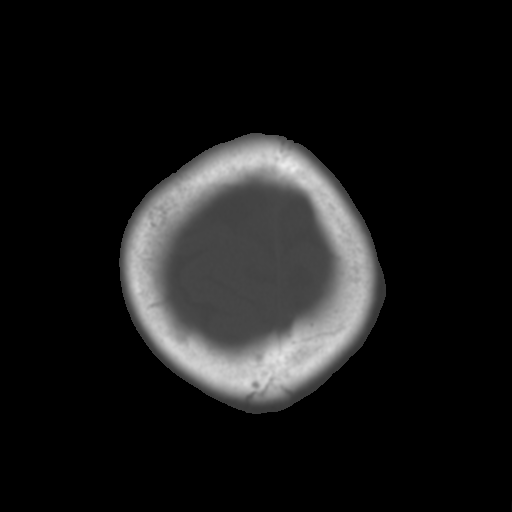
[im 28/30  brain]
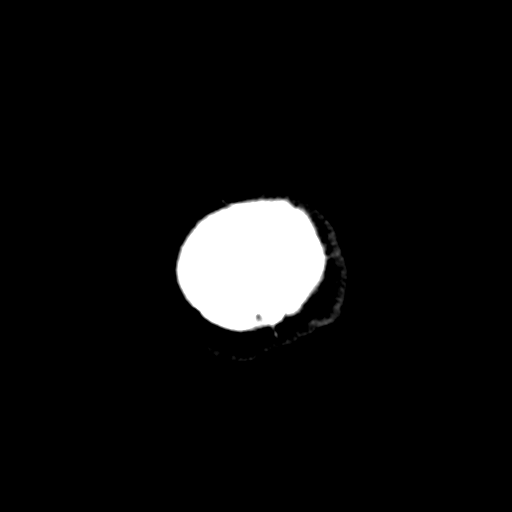

[Series 4: coronal soft · coronal · 0.31mm/px · 3 of 67 slices shown]
[im 23/67  brain]
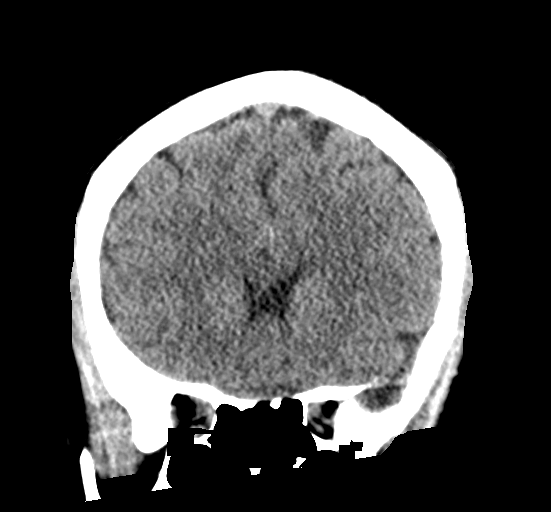
[im 30/67  brain]
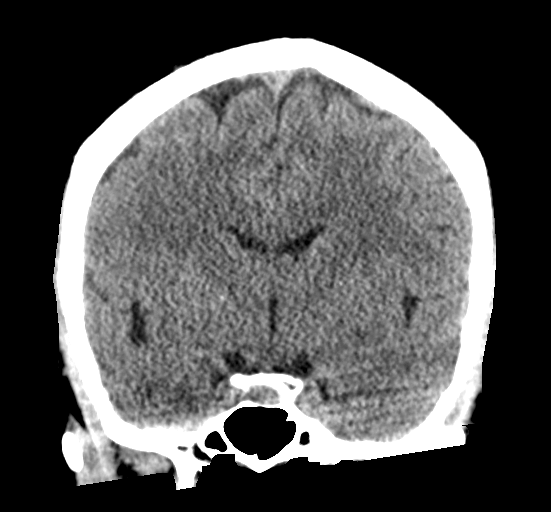
[im 37/67  brain]
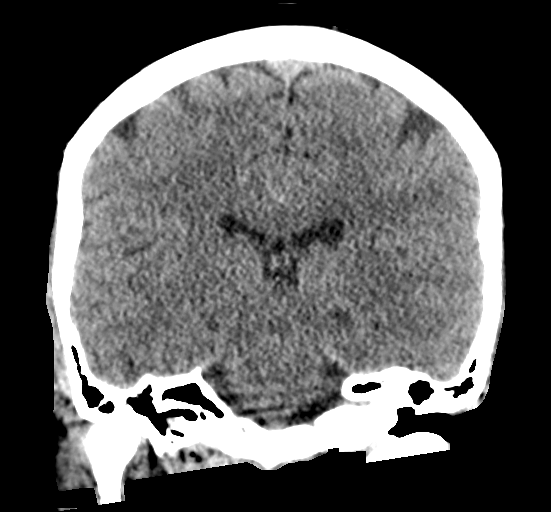

[Series 5: sagittal soft · sagittal · 0.33mm/px · 3 of 56 slices shown]
[im 20/56  brain]
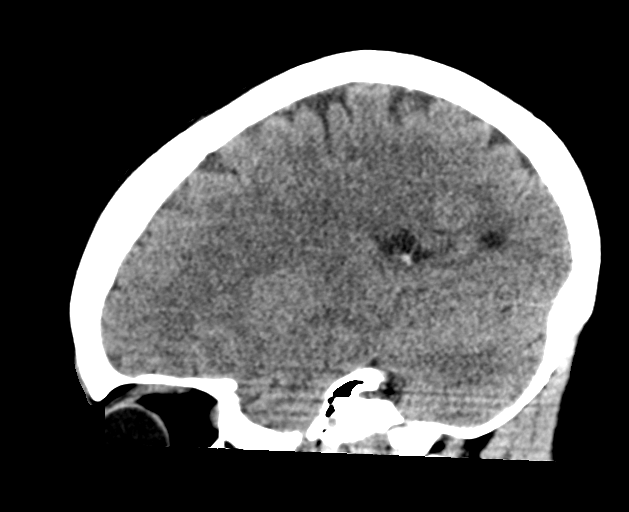
[im 28/56  brain]
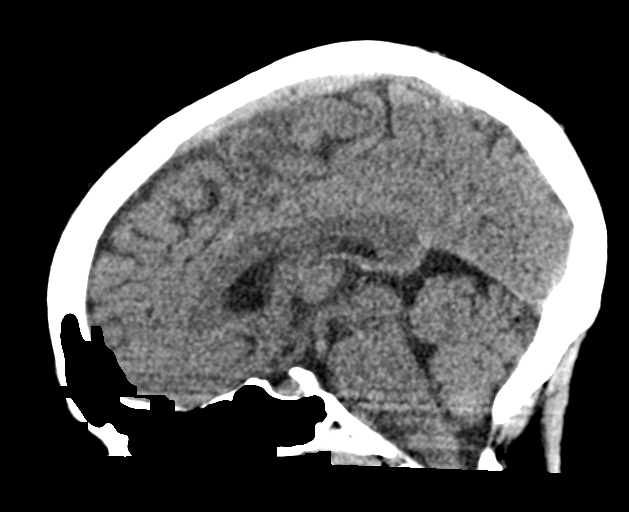
[im 35/56  brain]
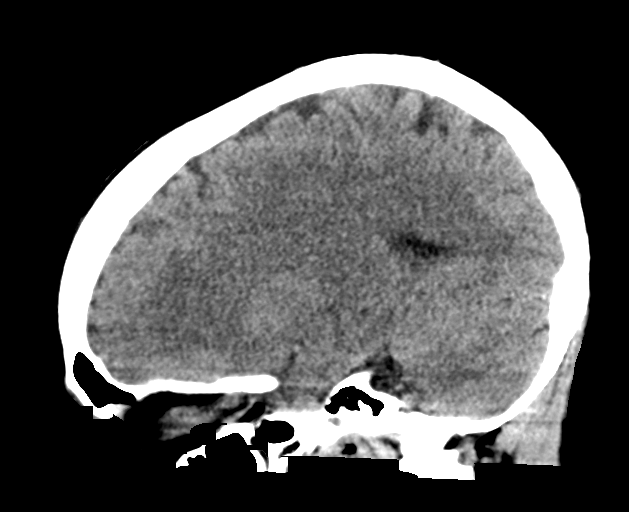

[16 of 47 positions shown; findings below may reference images not displayed]

FINDINGS: Brain: No intracranial hemorrhage, mass effect, or midline shift. No
hydrocephalus. The basilar cisterns are patent. No evidence of
territorial infarct or acute ischemia. No extra-axial or
intracranial fluid collection.

Vascular: No hyperdense vessel or unexpected calcification.

Skull: Normal. Negative for fracture or focal lesion.

Sinuses/Orbits: Paranasal sinuses and mastoid air cells are clear.
The visualized orbits are unremarkable.

Other: None.
IMPRESSION: Negative noncontrast head CT.

## 2022-05-04 DIAGNOSIS — I10 Essential (primary) hypertension: Secondary | ICD-10-CM | POA: Diagnosis not present

## 2022-05-04 DIAGNOSIS — A5900 Urogenital trichomoniasis, unspecified: Secondary | ICD-10-CM | POA: Diagnosis not present

## 2022-05-04 DIAGNOSIS — Z202 Contact with and (suspected) exposure to infections with a predominantly sexual mode of transmission: Secondary | ICD-10-CM | POA: Diagnosis not present

## 2022-05-16 DIAGNOSIS — I10 Essential (primary) hypertension: Secondary | ICD-10-CM | POA: Diagnosis not present

## 2022-05-16 DIAGNOSIS — Z113 Encounter for screening for infections with a predominantly sexual mode of transmission: Secondary | ICD-10-CM | POA: Diagnosis not present

## 2022-05-22 DIAGNOSIS — Z13220 Encounter for screening for lipoid disorders: Secondary | ICD-10-CM | POA: Diagnosis not present

## 2022-05-22 DIAGNOSIS — Z0001 Encounter for general adult medical examination with abnormal findings: Secondary | ICD-10-CM | POA: Diagnosis not present

## 2022-05-22 DIAGNOSIS — Z131 Encounter for screening for diabetes mellitus: Secondary | ICD-10-CM | POA: Diagnosis not present

## 2022-05-27 DIAGNOSIS — Z13220 Encounter for screening for lipoid disorders: Secondary | ICD-10-CM | POA: Diagnosis not present

## 2022-05-27 DIAGNOSIS — E559 Vitamin D deficiency, unspecified: Secondary | ICD-10-CM | POA: Diagnosis not present

## 2022-05-27 DIAGNOSIS — Z131 Encounter for screening for diabetes mellitus: Secondary | ICD-10-CM | POA: Diagnosis not present

## 2022-06-05 ENCOUNTER — Ambulatory Visit: Payer: BC Managed Care – PPO | Admitting: Internal Medicine

## 2022-06-05 ENCOUNTER — Encounter: Payer: Self-pay | Admitting: Internal Medicine

## 2022-06-05 VITALS — Temp 97.8°F | Resp 16 | Ht 64.0 in | Wt 161.0 lb

## 2022-06-05 DIAGNOSIS — I10 Essential (primary) hypertension: Secondary | ICD-10-CM | POA: Diagnosis not present

## 2022-06-05 DIAGNOSIS — E785 Hyperlipidemia, unspecified: Secondary | ICD-10-CM | POA: Insufficient documentation

## 2022-06-05 DIAGNOSIS — I119 Hypertensive heart disease without heart failure: Secondary | ICD-10-CM | POA: Diagnosis not present

## 2022-06-05 MED ORDER — ISOSORBIDE DINITRATE 30 MG PO TABS: 30.0000 mg | ORAL_TABLET | Freq: Three times a day (TID) | ORAL | 2 refills | Status: DC

## 2022-06-05 MED ORDER — HYDRALAZINE HCL 50 MG PO TABS: 50.0000 mg | ORAL_TABLET | Freq: Three times a day (TID) | ORAL | 2 refills | Status: AC

## 2022-06-05 MED ORDER — ROSUVASTATIN CALCIUM 10 MG PO TABS: 10.0000 mg | ORAL_TABLET | Freq: Every day | ORAL | 2 refills | Status: DC

## 2022-06-05 NOTE — Progress Notes (Signed)
Primary Physician/Referring:  Lindaann Pascal, PA-C  Patient ID: Katherine Ward, female    DOB: 07-11-1976, 46 y.o.   MRN: [id number]  No chief complaint on file.  HPI:    Katherine Ward  is a 46 y.o. female with past medical history significant for uncontrolled hypertension and hyperlipidemia who is here to establish care with cardiology. Patient has severely uncontrolled BP, she states her BP is the best its ever been today in office. We will add another blood pressure pill. She has never had work-up for secondary HTN and her daughter who is 44yo also has high blood pressure requiring medication. This runs in her entire family. Patient is agreeable to testing for potential secondary causes of her high blood pressure. Denies chest pain, shortness of breath, palpitations, diaphoresis, syncope, edema, PND, orthopnea.   Past Medical History:  Diagnosis Date   Hypertension    Past Surgical History:  Procedure Laterality Date   CESAREAN SECTION     HERNIA REPAIR     Family History  Problem Relation Age of Onset   Hypertension Mother    Hypertension Father    Bipolar disorder Paternal Aunt     Social History   Tobacco Use   Smoking status: Every Day    Packs/day: 0.10    Years: 12.00    Total pack years: 1.20    Types: Cigarettes   Smokeless tobacco: Never  Substance Use Topics   Alcohol use: No   Marital Status: Divorced  ROS  Review of Systems  Cardiovascular:  Negative for chest pain, claudication, cyanosis, dyspnea on exertion, irregular heartbeat, leg swelling, near-syncope, orthopnea, palpitations, paroxysmal nocturnal dyspnea and syncope.  All other systems reviewed and are negative. Objective  Temperature 97.8 F (36.6 C), temperature source Temporal, resp. rate 16, height 5\' 4"  (1.626 m), weight 161 lb (73 kg), SpO2 99 %. Body mass index is 27.64 kg/m.     06/05/2022    2:40 PM 10/03/2020    4:45 PM 10/03/2020    4:25 PM  Vitals with BMI  Height 5\' 4"   5\' 4"   Weight 161  lbs  145 lbs  BMI 27.62  24.88  Systolic  159   Diastolic  102   Pulse  72      Physical Exam Vitals and nursing note reviewed.  Constitutional:      Appearance: Normal appearance.  HENT:     Head: Normocephalic and atraumatic.  Neck:     Vascular: No carotid bruit.  Cardiovascular:     Rate and Rhythm: Normal rate and regular rhythm.     Pulses: Normal pulses.     Heart sounds: Murmur heard.  Pulmonary:     Effort: Pulmonary effort is normal.     Breath sounds: Normal breath sounds.  Abdominal:     General: Bowel sounds are normal.     Palpations: Abdomen is soft.  Musculoskeletal:     Right lower leg: No edema.     Left lower leg: No edema.  Skin:    General: Skin is warm and dry.  Neurological:     Mental Status: She is alert.   Medications and allergies   Allergies  Allergen Reactions   Acetaminophen     Gives patient a very high fever     Medication list after today's encounter   Current Outpatient Medications:    amLODipine (NORVASC) 5 MG tablet, Take 1 tablet (5 mg total) by mouth daily. (Patient taking differently: Take 5 mg  by mouth every other day.), Disp: 30 tablet, Rfl: 3   ergocalciferol (VITAMIN D2) 1.25 MG (50000 UT) capsule, Take 50,000 Units by mouth once a week., Disp: , Rfl:    hydrALAZINE (APRESOLINE) 50 MG tablet, Take 1 tablet (50 mg total) by mouth 3 (three) times daily., Disp: 90 tablet, Rfl: 2   isosorbide dinitrate (ISORDIL) 30 MG tablet, Take 1 tablet (30 mg total) by mouth 3 (three) times daily., Disp: 90 tablet, Rfl: 2   olmesartan (BENICAR) 40 MG tablet, Take 40 mg by mouth daily., Disp: , Rfl:    rosuvastatin (CRESTOR) 10 MG tablet, Take 1 tablet (10 mg total) by mouth at bedtime., Disp: 30 tablet, Rfl: 2  Laboratory examination:   Lab Results  Component Value Date   NA 139 06/07/2020   K 4.5 06/07/2020   CO2 26 06/07/2020   GLUCOSE 87 06/07/2020   BUN 8 06/07/2020   CREATININE 0.83 06/07/2020   CALCIUM 9.5 06/07/2020    GFRNONAA >60 06/07/2020       Latest Ref Rng & Units 06/07/2020   12:32 PM 11/09/2017   11:46 AM 12/21/2015   10:43 PM  CMP  Glucose 70 - 99 mg/dL 87  83  98   BUN 6 - 20 mg/dL 8  6  9    Creatinine 0.44 - 1.00 mg/dL  1.61  0.96   Sodium 135 - 145 mmol/L 139  140  140   Potassium 3.5 - 5.1 mmol/L 4.5  4.1  3.4   Chloride 98 - 111 mmol/L 105  103  109   CO2 22 - 32 mmol/L 26   25   Calcium 8.9 - 10.3 mg/dL 9.5   8.6   Total Protein 6.5 - 8.1 g/dL   6.7   Total Bilirubin 0.3 - 1.2 mg/dL   0.5   Alkaline Phos 38 - 126 U/L   45   AST 15 - 41 U/L   22   ALT 14 - 54 U/L   12       Latest Ref Rng & Units 06/07/2020   12:32 PM 11/09/2017   11:46 AM 12/21/2015   10:43 PM  CBC  WBC 4.0 - 10.5 K/uL 7.1   7.6   Hemoglobin 12.0 - 15.0 g/dL 12/23/2015  40.9  81.1   Hematocrit 36.0 - 46.0 % 41.9  44.0  36.4   Platelets 150 - 400 K/uL 208   203     Lipid Panel No results for input(s): "CHOL", "TRIG", "LDLCALC", "VLDL", "HDL", "CHOLHDL", "LDLDIRECT" in the last 8760 hours.  HEMOGLOBIN A1C Lab Results  Component Value Date   HGBA1C 5.2 12/16/2014   MPG 103 12/16/2014   TSH No results for input(s): "TSH" in the last 8760 hours.  External labs:     Radiology:    Cardiac Studies:   No results found for this or any previous visit from the past 1095 days.     No results found for this or any previous visit from the past 1095 days.     EKG:   06/05/22 normal sinus rhythm    Assessment     ICD-10-CM   1. Essential hypertension  I10 EKG 12-Lead    PCV ECHOCARDIOGRAM COMPLETE    PCV RENAL/RENAL ARTERY DUPLEX COMPLETE    Lipid Panel With LDL/HDL Ratio    Hgb A1c w/o eAG    Aldosterone + renin activity w/ ratio    Apo A1 + B + Ratio    2.  Hyperlipidemia LDL goal <70  E78.5 PCV ECHOCARDIOGRAM COMPLETE    PCV RENAL/RENAL ARTERY DUPLEX COMPLETE    Lipid Panel With LDL/HDL Ratio    Hgb A1c w/o eAG    Aldosterone + renin activity w/ ratio    Apo A1 + B + Ratio       Orders  Placed This Encounter  Procedures   Lipid Panel With LDL/HDL Ratio   Hgb A1c w/o eAG   Aldosterone + renin activity w/ ratio   Apo A1 + B + Ratio   EKG 12-Lead   PCV ECHOCARDIOGRAM COMPLETE    Standing Status:   Future    Standing Expiration Date:   06/06/2023    Meds ordered this encounter  Medications   rosuvastatin (CRESTOR) 10 MG tablet    Sig: Take 1 tablet (10 mg total) by mouth at bedtime.    Dispense:  30 tablet    Refill:  2   hydrALAZINE (APRESOLINE) 50 MG tablet    Sig: Take 1 tablet (50 mg total) by mouth 3 (three) times daily.    Dispense:  90 tablet    Refill:  2   isosorbide dinitrate (ISORDIL) 30 MG tablet    Sig: Take 1 tablet (30 mg total) by mouth 3 (three) times daily.    Dispense:  90 tablet    Refill:  2    Medications Discontinued During This Encounter  Medication Reason   amLODipine (NORVASC) 5 MG tablet    bacitracin ointment    benzonatate (TESSALON) 200 MG capsule    lisinopril-hydrochlorothiazide (ZESTORETIC) 20-12.5 MG tablet    metoCLOPramide (REGLAN) 10 MG tablet      Recommendations:   Katherine Ward is a 46 y.o.  F with severely uncontrolled HTN and HLD  Continue current cardiac medications. Hydralazing and Isordil sent to pharmacy. Starting crestor 10mg  nightly. Aldo/renin ratio, hgbA1c, Apo A1 + B, and lipid panel ordered. Encourage low-sodium diet, less than 2000 mg daily. Schedule imaging tests in office - echocardiogram and renal u/s for secondary HTN work-up. Follow-up in 2 months or sooner if needed.     , DO, [facility]  06/05/2022, 3:17 PM Office: [phone] Pager: [phone]

## 2022-07-04 ENCOUNTER — Ambulatory Visit: Payer: BC Managed Care – PPO

## 2022-07-04 DIAGNOSIS — I10 Essential (primary) hypertension: Secondary | ICD-10-CM

## 2022-07-04 DIAGNOSIS — E785 Hyperlipidemia, unspecified: Secondary | ICD-10-CM

## 2022-08-06 ENCOUNTER — Ambulatory Visit: Payer: BC Managed Care – PPO | Admitting: Internal Medicine

## 2022-08-15 DIAGNOSIS — Z91199 Patient's noncompliance with other medical treatment and regimen due to unspecified reason: Secondary | ICD-10-CM | POA: Diagnosis not present

## 2022-08-15 DIAGNOSIS — I10 Essential (primary) hypertension: Secondary | ICD-10-CM | POA: Diagnosis not present

## 2023-01-22 ENCOUNTER — Emergency Department (HOSPITAL_COMMUNITY)
Admission: EM | Admit: 2023-01-22 | Discharge: 2023-01-22 | Disposition: A | Discharge status: Home / Self Care | Payer: BC Managed Care – PPO

## 2023-01-22 DIAGNOSIS — S6992XA Unspecified injury of left wrist, hand and finger(s), initial encounter: Secondary | ICD-10-CM | POA: Diagnosis not present

## 2023-01-22 DIAGNOSIS — S6991XA Unspecified injury of right wrist, hand and finger(s), initial encounter: Secondary | ICD-10-CM | POA: Diagnosis not present

## 2023-01-22 DIAGNOSIS — S62620A Displaced fracture of medial phalanx of right index finger, initial encounter for closed fracture: Secondary | ICD-10-CM | POA: Diagnosis not present

## 2023-01-22 DIAGNOSIS — W228XXA Striking against or struck by other objects, initial encounter: Secondary | ICD-10-CM | POA: Diagnosis not present

## 2023-01-22 DIAGNOSIS — S60022A Contusion of left index finger without damage to nail, initial encounter: Secondary | ICD-10-CM | POA: Diagnosis not present

## 2023-01-22 DIAGNOSIS — S62650A Nondisplaced fracture of medial phalanx of right index finger, initial encounter for closed fracture: Secondary | ICD-10-CM | POA: Diagnosis not present

## 2023-01-22 DIAGNOSIS — Z79899 Other long term (current) drug therapy: Secondary | ICD-10-CM | POA: Diagnosis not present

## 2023-01-22 DIAGNOSIS — I10 Essential (primary) hypertension: Secondary | ICD-10-CM | POA: Diagnosis not present

## 2023-01-22 DIAGNOSIS — W232XXA Caught, crushed, jammed or pinched between a moving and stationary object, initial encounter: Secondary | ICD-10-CM | POA: Diagnosis not present

## 2023-01-22 NOTE — ED Notes (Signed)
Not in wiating area x 3

## 2023-01-22 NOTE — ED Notes (Signed)
Pt not in waiting area x 2 

## 2023-01-22 NOTE — ED Notes (Signed)
Not in waiting area x 1 

## 2023-01-28 DIAGNOSIS — M79644 Pain in right finger(s): Secondary | ICD-10-CM | POA: Diagnosis not present

## 2023-01-28 DIAGNOSIS — M79645 Pain in left finger(s): Secondary | ICD-10-CM | POA: Diagnosis not present

## 2023-01-28 DIAGNOSIS — M79642 Pain in left hand: Secondary | ICD-10-CM | POA: Diagnosis not present

## 2023-01-28 DIAGNOSIS — M79641 Pain in right hand: Secondary | ICD-10-CM | POA: Diagnosis not present

## 2023-02-19 DIAGNOSIS — M25641 Stiffness of right hand, not elsewhere classified: Secondary | ICD-10-CM | POA: Diagnosis not present

## 2023-02-19 DIAGNOSIS — M25642 Stiffness of left hand, not elsewhere classified: Secondary | ICD-10-CM | POA: Diagnosis not present

## 2023-02-26 DIAGNOSIS — M25642 Stiffness of left hand, not elsewhere classified: Secondary | ICD-10-CM | POA: Diagnosis not present

## 2023-02-26 DIAGNOSIS — M25641 Stiffness of right hand, not elsewhere classified: Secondary | ICD-10-CM | POA: Diagnosis not present

## 2023-02-27 DIAGNOSIS — M79644 Pain in right finger(s): Secondary | ICD-10-CM | POA: Diagnosis not present

## 2023-02-27 DIAGNOSIS — M79645 Pain in left finger(s): Secondary | ICD-10-CM | POA: Diagnosis not present

## 2023-02-28 DIAGNOSIS — M25641 Stiffness of right hand, not elsewhere classified: Secondary | ICD-10-CM | POA: Diagnosis not present

## 2023-02-28 DIAGNOSIS — M25642 Stiffness of left hand, not elsewhere classified: Secondary | ICD-10-CM | POA: Diagnosis not present

## 2023-03-04 DIAGNOSIS — M25641 Stiffness of right hand, not elsewhere classified: Secondary | ICD-10-CM | POA: Diagnosis not present

## 2023-03-04 DIAGNOSIS — M25642 Stiffness of left hand, not elsewhere classified: Secondary | ICD-10-CM | POA: Diagnosis not present

## 2023-03-06 DIAGNOSIS — M25642 Stiffness of left hand, not elsewhere classified: Secondary | ICD-10-CM | POA: Diagnosis not present

## 2023-03-06 DIAGNOSIS — M25641 Stiffness of right hand, not elsewhere classified: Secondary | ICD-10-CM | POA: Diagnosis not present

## 2023-03-11 DIAGNOSIS — M25642 Stiffness of left hand, not elsewhere classified: Secondary | ICD-10-CM | POA: Diagnosis not present

## 2023-03-11 DIAGNOSIS — M25641 Stiffness of right hand, not elsewhere classified: Secondary | ICD-10-CM | POA: Diagnosis not present

## 2023-06-04 DIAGNOSIS — K429 Umbilical hernia without obstruction or gangrene: Secondary | ICD-10-CM | POA: Diagnosis not present

## 2023-06-04 DIAGNOSIS — Z0000 Encounter for general adult medical examination without abnormal findings: Secondary | ICD-10-CM | POA: Diagnosis not present

## 2023-06-05 DIAGNOSIS — Z0000 Encounter for general adult medical examination without abnormal findings: Secondary | ICD-10-CM | POA: Diagnosis not present

## 2023-06-05 DIAGNOSIS — Z13220 Encounter for screening for lipoid disorders: Secondary | ICD-10-CM | POA: Diagnosis not present

## 2023-06-19 DIAGNOSIS — K429 Umbilical hernia without obstruction or gangrene: Secondary | ICD-10-CM | POA: Diagnosis not present

## 2023-07-30 ENCOUNTER — Encounter: Payer: Self-pay | Admitting: Surgery

## 2023-07-30 ENCOUNTER — Ambulatory Visit: Payer: BC Managed Care – PPO | Admitting: Surgery

## 2023-07-30 VITALS — BP 166/99 | HR 78 | Temp 98.0°F | Resp 16 | Ht 64.0 in | Wt 167.0 lb

## 2023-07-30 DIAGNOSIS — K429 Umbilical hernia without obstruction or gangrene: Secondary | ICD-10-CM

## 2023-07-30 NOTE — Progress Notes (Signed)
[facility] History and Physical  Reason for Referral: Recurrent umbilical hernia Referring Physician: Self referral  Chief Complaint   New Patient (Initial Visit)     Katherine Ward is a 47 y.o. female.  HPI: Patient presents for evaluation of an umbilical hernia.  She has a history of supraumbilical hernia, status post open umbilical hernia repair and eating.  She does not believe mesh was used, though she is not sure.  She first noted that the area recurred about 3 years ago, and started bothering her more this year in May.  Since this spring, she notes pain when the area seems to pop out, and she also notes some decreased appetite when the area is popping out.  She does not try to reduce it herself, though verifies that it goes down on its own when lying down.  She also has pain associated with lifting at work.  Her past medical history significant for hypertension.  She denies use of blood thinning medications.  Her surgical history significant for an open supraumbilical hernia repair and C-section.  She quit smoking 7 months ago.  She denies use of alcohol and illicit drugs.  Past Medical History:  Diagnosis Date   Hypertension     Past Surgical History:  Procedure Laterality Date   CESAREAN SECTION     HERNIA REPAIR      Family History  Problem Relation Age of Onset   Hypertension Mother    Hypertension Father    Bipolar disorder Paternal Aunt     Social History   Tobacco Use   Smoking status: Former    Current packs/day: 0.00    Average packs/day: 0.1 packs/day for 12.0 years (1.2 ttl pk-yrs)    Types: Cigarettes    Quit date: 2024    Years since quitting: 0.8   Smokeless tobacco: Never  Vaping Use   Vaping status: Never Used  Substance Use Topics   Alcohol use: No   Drug use: No    Types: Marijuana    Comment: denies    Medications: I have reviewed the patient's current medications. Allergies as of 07/30/2023       Reactions    Acetaminophen    Gives patient a very high fever        Medication List        Accurate as of July 30, 2023  3:49 PM. If you have any questions, ask your nurse or doctor.          STOP taking these medications    isosorbide dinitrate 30 MG tablet Commonly known as: ISORDIL Stopped by: Breuna Loveall A Mykai Wendorf   rosuvastatin 10 MG tablet Commonly known as: CRESTOR Stopped by: Yitzchok Carriger A Jaydian Santana       TAKE these medications    amLODipine 5 MG tablet Commonly known as: NORVASC Take 1 tablet (5 mg total) by mouth daily. What changed: when to take this   ergocalciferol 1.25 MG (50000 UT) capsule Commonly known as: VITAMIN D2 Take 50,000 Units by mouth once a week.   hydrALAZINE 50 MG tablet Commonly known as: APRESOLINE Take 1 tablet (50 mg total) by mouth 3 (three) times daily.   olmesartan 40 MG tablet Commonly known as: BENICAR Take 40 mg by mouth daily.         ROS:  Constitutional: negative for chills, fatigue, and fevers Eyes: negative for visual disturbance and pain Ears, nose, mouth, throat, and face: negative for ear drainage, sore throat, and sinus problems Respiratory:  negative for cough, wheezing, and shortness of breath Cardiovascular: negative for chest pain and palpitations Gastrointestinal: negative for abdominal pain, nausea, reflux symptoms, and vomiting Genitourinary:negative for dysuria and frequency Integument/breast: negative for dryness and rash Hematologic/lymphatic: negative for bleeding and lymphadenopathy Musculoskeletal:negative for back pain and neck pain Neurological: negative for dizziness and tremors Endocrine: negative for temperature intolerance  Blood pressure (!) 166/99, pulse 78, temperature 98 F (36.7 C), temperature source Oral, resp. rate 16, height 5\' 4"  (1.626 m), weight 167 lb (75.8 kg), SpO2 97%. Physical Exam Vitals reviewed.  Constitutional:      Appearance: Normal appearance.  HENT:     Head:  Normocephalic and atraumatic.  Eyes:     Extraocular Movements: Extraocular movements intact.     Pupils: Pupils are equal, round, and reactive to light.  Cardiovascular:     Rate and Rhythm: Normal rate and regular rhythm.  Pulmonary:     Effort: Pulmonary effort is normal.     Breath sounds: Normal breath sounds.  Abdominal:     Comments: Abdomen soft, nondistended, no percussion tenderness, nontender to palpation; no rigidity, guarding, rebound tenderness; incision site below umbilicus, 2 cm umbilical hernia defect palpable, soft and reducible  Musculoskeletal:        General: Normal range of motion.     Cervical back: Normal range of motion.  Skin:    General: Skin is warm and dry.  Neurological:     General: No focal deficit present.     Mental Status: She is alert and oriented to person, place, and time.  Psychiatric:        Mood and Affect: Mood normal.        Behavior: Behavior normal.     Results: No results found for this or any previous visit (from the past 48 hour(s)).  No results found.   Assessment & Plan:  Katherine Ward is a 47 y.o. female who presents for evaluation of a recurrent umbilical hernia  -I discussed the pathophysiology of umbilical hernias and we discussed the need for surgical repair -The risk and benefits of robotic assisted laparoscopic umbilical hernia repair with mesh were discussed including but not limited to bleeding, infection, injury to surrounding structures, need for additional procedures, and hernia recurrence.  After careful consideration, Malarie Tappen has decided to proceed with surgery.  -Patient tentatively scheduled for surgery on 11/21 -Information provided to the patient regarding umbilical hernias -Advised that the patient should present to the ED if they begin to have a painful nonreducible bulge at her umbilicus, nausea, vomiting, and obstipation  All questions were answered to the satisfaction of the patient.  Theophilus Kinds, DO [facility] [address] Vella Raring [place], [place] [address] [phone] (office)

## 2023-08-02 NOTE — H&P (Signed)
[facility] History and Physical  Reason for Referral: Recurrent umbilical hernia Referring Physician: Self referral  Chief Complaint   New Patient (Initial Visit)     Katherine Ward is a 47 y.o. female.  HPI: Patient presents for evaluation of an umbilical hernia.  She has a history of supraumbilical hernia, status post open umbilical hernia repair and eating.  She does not believe mesh was used, though she is not sure.  She first noted that the area recurred about 3 years ago, and started bothering her more this year in May.  Since this spring, she notes pain when the area seems to pop out, and she also notes some decreased appetite when the area is popping out.  She does not try to reduce it herself, though verifies that it goes down on its own when lying down.  She also has pain associated with lifting at work.  Her past medical history significant for hypertension.  She denies use of blood thinning medications.  Her surgical history significant for an open supraumbilical hernia repair and C-section.  She quit smoking 7 months ago.  She denies use of alcohol and illicit drugs.  Past Medical History:  Diagnosis Date   Hypertension     Past Surgical History:  Procedure Laterality Date   CESAREAN SECTION     HERNIA REPAIR      Family History  Problem Relation Age of Onset   Hypertension Mother    Hypertension Father    Bipolar disorder Paternal Aunt     Social History   Tobacco Use   Smoking status: Former    Current packs/day: 0.00    Average packs/day: 0.1 packs/day for 12.0 years (1.2 ttl pk-yrs)    Types: Cigarettes    Quit date: 2024    Years since quitting: 0.8   Smokeless tobacco: Never  Vaping Use   Vaping status: Never Used  Substance Use Topics   Alcohol use: No   Drug use: No    Types: Marijuana    Comment: denies    Medications: I have reviewed the patient's current medications. Allergies as of 07/30/2023       Reactions    Acetaminophen    Gives patient a very high fever        Medication List        Accurate as of July 30, 2023  3:49 PM. If you have any questions, ask your nurse or doctor.          STOP taking these medications    isosorbide dinitrate 30 MG tablet Commonly known as: ISORDIL Stopped by: Breuna Loveall A Mykai Wendorf   rosuvastatin 10 MG tablet Commonly known as: CRESTOR Stopped by: Yitzchok Carriger A Jaydian Santana       TAKE these medications    amLODipine 5 MG tablet Commonly known as: NORVASC Take 1 tablet (5 mg total) by mouth daily. What changed: when to take this   ergocalciferol 1.25 MG (50000 UT) capsule Commonly known as: VITAMIN D2 Take 50,000 Units by mouth once a week.   hydrALAZINE 50 MG tablet Commonly known as: APRESOLINE Take 1 tablet (50 mg total) by mouth 3 (three) times daily.   olmesartan 40 MG tablet Commonly known as: BENICAR Take 40 mg by mouth daily.         ROS:  Constitutional: negative for chills, fatigue, and fevers Eyes: negative for visual disturbance and pain Ears, nose, mouth, throat, and face: negative for ear drainage, sore throat, and sinus problems Respiratory:  negative for cough, wheezing, and shortness of breath Cardiovascular: negative for chest pain and palpitations Gastrointestinal: negative for abdominal pain, nausea, reflux symptoms, and vomiting Genitourinary:negative for dysuria and frequency Integument/breast: negative for dryness and rash Hematologic/lymphatic: negative for bleeding and lymphadenopathy Musculoskeletal:negative for back pain and neck pain Neurological: negative for dizziness and tremors Endocrine: negative for temperature intolerance  Blood pressure (!) 166/99, pulse 78, temperature 98 F (36.7 C), temperature source Oral, resp. rate 16, height 5\' 4"  (1.626 m), weight 167 lb (75.8 kg), SpO2 97%. Physical Exam Vitals reviewed.  Constitutional:      Appearance: Normal appearance.  HENT:     Head:  Normocephalic and atraumatic.  Eyes:     Extraocular Movements: Extraocular movements intact.     Pupils: Pupils are equal, round, and reactive to light.  Cardiovascular:     Rate and Rhythm: Normal rate and regular rhythm.  Pulmonary:     Effort: Pulmonary effort is normal.     Breath sounds: Normal breath sounds.  Abdominal:     Comments: Abdomen soft, nondistended, no percussion tenderness, nontender to palpation; no rigidity, guarding, rebound tenderness; incision site below umbilicus, 2 cm umbilical hernia defect palpable, soft and reducible  Musculoskeletal:        General: Normal range of motion.     Cervical back: Normal range of motion.  Skin:    General: Skin is warm and dry.  Neurological:     General: No focal deficit present.     Mental Status: She is alert and oriented to person, place, and time.  Psychiatric:        Mood and Affect: Mood normal.        Behavior: Behavior normal.     Results: No results found for this or any previous visit (from the past 48 hour(s)).  No results found.   Assessment & Plan:  Katherine Ward is a 47 y.o. female who presents for evaluation of a recurrent umbilical hernia  -I discussed the pathophysiology of umbilical hernias and we discussed the need for surgical repair -The risk and benefits of robotic assisted laparoscopic umbilical hernia repair with mesh were discussed including but not limited to bleeding, infection, injury to surrounding structures, need for additional procedures, and hernia recurrence.  After careful consideration, Malarie Tappen has decided to proceed with surgery.  -Patient tentatively scheduled for surgery on 11/21 -Information provided to the patient regarding umbilical hernias -Advised that the patient should present to the ED if they begin to have a painful nonreducible bulge at her umbilicus, nausea, vomiting, and obstipation  All questions were answered to the satisfaction of the patient.  Theophilus Kinds, DO [facility] [address] Vella Raring [place], [place] [address] [phone] (office)

## 2023-08-19 NOTE — Patient Instructions (Signed)
Katherine Ward  08/19/2023     @PREFPERIOPPHARMACY @   Your procedure is scheduled on 08/22/2023.   Report to [facility] at 6:00 A.M.   Call this number if you have problems the morning of surgery:  [phone]  If you experience any cold or flu symptoms such as cough, fever, chills, shortness of breath, etc. between now and your scheduled surgery, please notify us at the above number.   Remember:  Do not eat anything after midnight.  You may drink clear liquids until 3:30 am .  Clear liquids allowed are:                    Water, Carbonated beverages (diabetics please choose diet or no sugar options), Clear Tea (No creamer, milk, or cream, including half & half and powdered creamer), and Clear Sports drink (No red color; diabetics please choose diet or no sugar options)    Take these medicines the morning of surgery with A SIP OF WATER : Norvasc    Do not wear jewelry, make-up or nail polish, including gel polish,  artificial nails, or any other type of covering on natural nails (fingers and  toes).  Do not wear lotions, powders, or perfumes, or deodorant.  Do not shave 48 hours prior to surgery.  Men may shave face and neck.  Do not bring valuables to the hospital.  [facility] is not responsible for any belongings or valuables.  Contacts, dentures or bridgework may not be worn into surgery.  Leave your suitcase in the car.  After surgery it may be brought to your room.  For patients admitted to the hospital, discharge time will be determined by your treatment team.  Patients discharged the day of surgery will not be allowed to drive home.   Name and phone number of your driver:   family Special instructions:  N/A  Please read over the following fact sheets that you were given. Care and Recovery After Surgery    Laparoscopic Ventral Hernia Repair Laparoscopic ventral hernia repairis a procedure to fix a bulge of tissue that pushes through a weak area of muscle in the  abdomen (ventral hernia). A ventral hernia may be: Above the belly button. This is called an epigastric hernia. At the belly button. This is called an umbilical hernia. At the incision site from previous abdominal surgery. This is called an incisional hernia. You may have this procedure as emergency surgery if part of your intestine gets trapped inside the hernia and starts to lose its blood supply (strangulation). Laparoscopic surgery is done through small incisions using a thin surgical telescope with a light and camera (laparoscope). During surgery, your surgeon will use images from the laparoscope to guide the procedure. A mesh screen will be placed in the hernia to close the opening and strengthen the abdominal wall. Tell a health care provider about: Any allergies you have. All medicines you are taking, including vitamins, herbs, eye drops, creams, and over-the-counter medicines. Any problems you or family members have had with anesthetic medicines. Any blood disorders you have. Any surgeries you have had. Any medical conditions you have. Whether you are pregnant or may be pregnant. What are the risks? Generally, this is a safe procedure. However, problems may occur, including: Infection. Bleeding. Damage to nearby structures or organs in the abdomen. Trouble urinating or having a bowel movement after surgery. Blood clots. The hernia coming back after surgery. Fluid buildup in the area of the hernia. In  some cases, your health care provider may need to switch from a laparoscopic procedure to a procedure that is done through a single, larger incision in the abdomen (open procedure). You may need an open procedure if: You have a hernia that is difficult to repair. Your organs are hard to see with the laparoscope. You have bleeding problems during the laparoscopic procedure. What happens before the procedure? Staying hydrated Follow instructions from your health care provider about  hydration, which may include: Up to 2 hours before the procedure - you may continue to drink clear liquids, such as water, clear fruit juice, black coffee, and plain tea.  Eating and drinking restrictions Follow instructions from your health care provider about eating and drinking, which may include: 8 hours before the procedure - stop eating heavy meals or foods, such as meat, fried foods, or fatty foods. 6 hours before the procedure - stop eating light meals or foods, such as toast or cereal. 6 hours before the procedure - stop drinking milk or drinks that contain milk. 2 hours before the procedure - stop drinking clear liquids. Medicines Ask your health care provider about: Changing or stopping your regular medicines. This is especially important if you are taking diabetes medicines or blood thinners. Taking medicines such as aspirin and ibuprofen. These medicines can thin your blood. Do not take these medicines unless your health care provider tells you to take them. Taking over-the-counter medicines, vitamins, herbs, and supplements. Tests You may need to have tests before the procedure, such as: Blood tests. Urine tests. Abdominal ultrasound. Chest X-ray. Electrocardiogram (ECG). General instructions You may be asked to take a laxative or do an enema to empty your bowel before surgery (bowel prep). Do not use any products that contain nicotine or tobacco for at least 4 weeks before the procedure. These products include cigarettes, chewing tobacco, and vaping devices, such as e-cigarettes. If you need help quitting, ask your health care provider. Ask your health care provider: How your surgery site will be marked. What steps will be taken to help prevent infection. These steps may include: Removing hair at the surgery site. Washing skin with a germ-killing soap. Receiving antibiotic medicine. Plan to have a responsible adult take you home from the hospital or clinic. If you will  be going home right after the procedure, plan to have a responsible adult care for you for the time you are told. This is important. What happens during the procedure?  An IV will be inserted into one of your veins. You will be given one or more of the following: A medicine to help you relax (sedative). A medicine to numb the area (local anesthetic). A medicine to make you fall asleep (general anesthetic). A small incision will be made in your abdomen. A hollow metal tube (trocar) will be placed through the incision. A tube will be placed through the trocar to inflate your abdomen with carbon dioxide. This makes it easier for your surgeon to see inside your abdomen during the repair. A laparoscope will be inserted into your abdomen through the trocar. The laparoscope will send images to a monitor in the operating room. Other trocars will be put through other small incisions in your abdomen. The surgical instruments needed for the procedure will be placed through these trocars. The tissue or intestines that make up the hernia will be moved back into place. The edges of the hernia may be stitched (sutured) together. A piece of mesh will be used to close the  hernia. Sutures, clips, or staples will be used to keep the mesh in place. A bandage (dressing) or skin glue will be put over the incisions. The procedure may vary among health care providers and hospitals. What happens after the procedure? Your blood pressure, heart rate, breathing rate, and blood oxygen level will be monitored until you leave the hospital or clinic. You will continue to receive fluids and medicines through an IV. Your IV will be removed when you can drink clear fluids. You will be given pain medicine as needed. You will be encouraged to get up and walk around as soon as possible. You will be shown how to do deep breathing exercises to help prevent a lung infection. If you were given a sedative during the procedure, it can  affect you for several hours. Do not drive or operate machinery until your health care provider says that it is safe. Summary Laparoscopic ventral hernia is an operation to fix a hernia using small incisions. Tell your health care provider about other medical conditions that you have and about all the medicines that you are taking. Follow instructions from your health care provider about eating and drinking before the procedure. Plan to have a responsible adult take you home from the hospital or clinic. After the procedure, you will be encouraged to walk as soon as possible. You will also be taught how to do deep breathing exercises. This information is not intended to replace advice given to you by your health care provider. Make sure you discuss any questions you have with your health care provider. Document Revised: 05/06/2020 Document Reviewed: 05/06/2020 Elsevier Patient Education  2024 [employer]  General Anesthesia, Adult General anesthesia is the use of medicine to make you fall asleep (unconscious) for a medical procedure. General anesthesia must be used for certain procedures. It is often recommended for surgery or procedures that: Last a long time. Require you to be still or in an unusual position. Are major and can cause blood loss. Affect your breathing. The medicines used for general anesthesia are called general anesthetics. During general anesthesia, these medicines are given along with medicines that: Prevent pain. Control your blood pressure. Relax your muscles. Prevent nausea and vomiting after the procedure. Tell a health care provider about: Any allergies you have. All medicines you are taking, including vitamins, herbs, eye drops, creams, and over-the-counter medicines. Your history of any: Medical conditions you have, including: High blood pressure. Bleeding problems. Diabetes. Heart or lung conditions, such as: Heart failure. Sleep apnea. Asthma. Chronic  obstructive pulmonary disease (COPD). Current or recent illnesses, such as: Upper respiratory, chest, or ear infections. Cough or fever. Tobacco or drug use, including marijuana or alcohol use. Depression or anxiety. Surgeries and types of anesthetics you have had. Problems you or family members have had with anesthetic medicines. Whether you are pregnant or may be pregnant. Whether you have any chipped or loose teeth, dentures, caps, bridgework, or issues with your mouth, swallowing, or choking. What are the risks? Your health care provider will talk with you about risks. These may include: Allergic reaction to the medicines. Lung and heart problems. Inhaling food or liquid from the stomach into the lungs (aspiration). Nerve injury. Injury to the lips, mouth, teeth, or gums. Stroke. Waking up during your procedure and being unable to move. This is rare. These problems are more likely to develop if you are having a major surgery or if you have an advanced or serious medical condition. You can prevent some  of these complications by answering all of your health care provider's questions thoroughly and by following all instructions before your procedure. General anesthesia can cause side effects, including: Nausea or vomiting. A sore throat or hoarseness from the breathing tube. Wheezing or coughing. Shaking chills or feeling cold. Body aches. Sleepiness. Confusion, agitation (delirium), or anxiety. What happens before the procedure? When to stop eating and drinking Follow instructions from your health care provider about what you may eat and drink before your procedure. If you do not follow your health care provider's instructions, your procedure may be delayed or canceled. Medicines Ask your health care provider about: Changing or stopping your regular medicines. These include any diabetes medicines or blood thinners you take. Taking medicines such as aspirin and ibuprofen. These  medicines can thin your blood. Do not take them unless your health care provider tells you to. Taking over-the-counter medicines, vitamins, herbs, and supplements. General instructions Do not use any products that contain nicotine or tobacco for at least 4 weeks before the procedure. These products include cigarettes, chewing tobacco, and vaping devices, such as e-cigarettes. If you need help quitting, ask your health care provider. If you brush your teeth on the morning of the procedure, make sure to spit out all of the water and toothpaste. If told by your health care provider, bring your sleep apnea device with you to surgery (if applicable). If you will be going home right after the procedure, plan to have a responsible adult: Take you home from the hospital or clinic. You will not be allowed to drive. Care for you for the time you are told. What happens during the procedure?  An IV will be inserted into one of your veins. You will be given one or more of the following through a face mask or IV: A sedative. This helps you relax. Anesthesia. This will: Numb certain areas of your body. Make you fall asleep for surgery. After you are unconscious, a breathing tube may be inserted down your throat to help you breathe. This will be removed before you wake up. An anesthesia provider, such as an anesthesiologist, will stay with you throughout your procedure. The anesthesia provider will: Keep you comfortable and safe by continuing to give you medicines and adjusting the amount of medicine that you get. Monitor your blood pressure, heart rate, and oxygen levels to make sure that the anesthetics do not cause any problems. The procedure may vary among health care providers and hospitals. What happens after the procedure? Your blood pressure, temperature, heart rate, breathing rate, and blood oxygen level will be monitored until you leave the hospital or clinic. You will wake up in a recovery area.  You may wake up slowly. You may be given medicine to help you with pain, nausea, or any other side effects from the anesthesia. Summary General anesthesia is the use of medicine to make you fall asleep (unconscious) for a medical procedure. Follow your health care provider's instructions about when to stop eating, drinking, or taking certain medicines before your procedure. Plan to have a responsible adult take you home from the hospital or clinic. This information is not intended to replace advice given to you by your health care provider. Make sure you discuss any questions you have with your health care provider. Document Revised: 12/14/2021 Document Reviewed: 12/14/2021 Elsevier Patient Education  2024 [employer]  How to Use Chlorhexidine Before Surgery Chlorhexidine gluconate (CHG) is a germ-killing (antiseptic) solution that is used to clean the skin.  It can get rid of the bacteria that normally live on the skin and can keep them away for about 24 hours. To clean your skin with CHG, you may be given: A CHG solution to use in the shower or as part of a sponge bath. A prepackaged cloth that contains CHG. Cleaning your skin with CHG may help lower the risk for infection: While you are staying in the intensive care unit of the hospital. If you have a vascular access, such as a central line, to provide short-term or long-term access to your veins. If you have a catheter to drain urine from your bladder. If you are on a ventilator. A ventilator is a machine that helps you breathe by moving air in and out of your lungs. After surgery. What are the risks? Risks of using CHG include: A skin reaction. Hearing loss, if CHG gets in your ears and you have a perforated eardrum. Eye injury, if CHG gets in your eyes and is not rinsed out. The CHG product catching fire. Make sure that you avoid smoking and flames after applying CHG to your skin. Do not use CHG: If you have a chlorhexidine  allergy or have previously reacted to chlorhexidine. On babies younger than 97 months of age. How to use CHG solution Use CHG only as told by your health care provider, and follow the instructions on the label. Use the full amount of CHG as directed. Usually, this is one bottle. During a shower Follow these steps when using CHG solution during a shower (unless your health care provider gives you different instructions): Start the shower. Use your normal soap and shampoo to wash your face and hair. Turn off the shower or move out of the shower stream. Pour the CHG onto a clean washcloth. Do not use any type of brush or rough-edged sponge. Starting at your neck, lather your body down to your toes. Make sure you follow these instructions: If you will be having surgery, pay special attention to the part of your body where you will be having surgery. Scrub this area for at least 1 minute. Do not use CHG on your head or face. If the solution gets into your ears or eyes, rinse them well with water. Avoid your genital area. Avoid any areas of skin that have broken skin, cuts, or scrapes. Scrub your back and under your arms. Make sure to wash skin folds. Let the lather sit on your skin for 1-2 minutes or as long as told by your health care provider. Thoroughly rinse your entire body in the shower. Make sure that all body creases and crevices are rinsed well. Dry off with a clean towel. Do not put any substances on your body afterward--such as powder, lotion, or perfume--unless you are told to do so by your health care provider. Only use lotions that are recommended by the manufacturer. Put on clean clothes or pajamas. If it is the night before your surgery, sleep in clean sheets.  During a sponge bath Follow these steps when using CHG solution during a sponge bath (unless your health care provider gives you different instructions): Use your normal soap and shampoo to wash your face and hair. Pour the  CHG onto a clean washcloth. Starting at your neck, lather your body down to your toes. Make sure you follow these instructions: If you will be having surgery, pay special attention to the part of your body where you will be having surgery. Scrub this area for at  least 1 minute. Do not use CHG on your head or face. If the solution gets into your ears or eyes, rinse them well with water. Avoid your genital area. Avoid any areas of skin that have broken skin, cuts, or scrapes. Scrub your back and under your arms. Make sure to wash skin folds. Let the lather sit on your skin for 1-2 minutes or as long as told by your health care provider. Using a different clean, wet washcloth, thoroughly rinse your entire body. Make sure that all body creases and crevices are rinsed well. Dry off with a clean towel. Do not put any substances on your body afterward--such as powder, lotion, or perfume--unless you are told to do so by your health care provider. Only use lotions that are recommended by the manufacturer. Put on clean clothes or pajamas. If it is the night before your surgery, sleep in clean sheets. How to use CHG prepackaged cloths Only use CHG cloths as told by your health care provider, and follow the instructions on the label. Use the CHG cloth on clean, dry skin. Do not use the CHG cloth on your head or face unless your health care provider tells you to. When washing with the CHG cloth: Avoid your genital area. Avoid any areas of skin that have broken skin, cuts, or scrapes. Before surgery Follow these steps when using a CHG cloth to clean before surgery (unless your health care provider gives you different instructions): Using the CHG cloth, vigorously scrub the part of your body where you will be having surgery. Scrub using a back-and-forth motion for 3 minutes. The area on your body should be completely wet with CHG when you are done scrubbing. Do not rinse. Discard the cloth and let the area  air-dry. Do not put any substances on the area afterward, such as powder, lotion, or perfume. Put on clean clothes or pajamas. If it is the night before your surgery, sleep in clean sheets.  For general bathing Follow these steps when using CHG cloths for general bathing (unless your health care provider gives you different instructions). Use a separate CHG cloth for each area of your body. Make sure you wash between any folds of skin and between your fingers and toes. Wash your body in the following order, switching to a new cloth after each step: The front of your neck, shoulders, and chest. Both of your arms, under your arms, and your hands. Your stomach and groin area, avoiding the genitals. Your right leg and foot. Your left leg and foot. The back of your neck, your back, and your buttocks. Do not rinse. Discard the cloth and let the area air-dry. Do not put any substances on your body afterward--such as powder, lotion, or perfume--unless you are told to do so by your health care provider. Only use lotions that are recommended by the manufacturer. Put on clean clothes or pajamas. Contact a health care provider if: Your skin gets irritated after scrubbing. You have questions about using your solution or cloth. You swallow any chlorhexidine. Call your local poison control center (718-130-4285 in the [place] Get help right away if: Your eyes itch badly, or they become very red or swollen. Your skin itches badly and is red or swollen. Your hearing changes. You have trouble seeing. You have swelling or tingling in your mouth or throat. You have trouble breathing. These symptoms may represent a serious problem that is an emergency. Do not wait to see if the symptoms will go  away. Get medical help right away. Call your local emergency services (911 in the [place] Do not drive yourself to the hospital. Summary Chlorhexidine gluconate (CHG) is a germ-killing (antiseptic) solution that is used  to clean the skin. Cleaning your skin with CHG may help to lower your risk for infection. You may be given CHG to use for bathing. It may be in a bottle or in a prepackaged cloth to use on your skin. Carefully follow your health care provider's instructions and the instructions on the product label. Do not use CHG if you have a chlorhexidine allergy. Contact your health care provider if your skin gets irritated after scrubbing. This information is not intended to replace advice given to you by your health care provider. Make sure you discuss any questions you have with your health care provider. Document Revised: 01/15/2022 Document Reviewed: 11/28/2020 Elsevier Patient Education  2023 [employer].

## 2023-08-20 ENCOUNTER — Encounter (HOSPITAL_COMMUNITY): Payer: Self-pay

## 2023-08-20 ENCOUNTER — Encounter (HOSPITAL_COMMUNITY)
Admission: RE | Admit: 2023-08-20 | Discharge: 2023-08-20 | Disposition: A | Discharge status: Home / Self Care | Payer: BC Managed Care – PPO | Source: Ambulatory Visit | Attending: Surgery | Admitting: Surgery

## 2023-08-20 VITALS — BP 166/88 | HR 78 | Temp 98.0°F | Resp 18 | Ht 64.0 in | Wt 167.0 lb

## 2023-08-20 DIAGNOSIS — Z87891 Personal history of nicotine dependence: Secondary | ICD-10-CM | POA: Diagnosis not present

## 2023-08-20 DIAGNOSIS — I10 Essential (primary) hypertension: Secondary | ICD-10-CM | POA: Diagnosis not present

## 2023-08-20 DIAGNOSIS — Z01812 Encounter for preprocedural laboratory examination: Secondary | ICD-10-CM | POA: Insufficient documentation

## 2023-08-20 DIAGNOSIS — Z01818 Encounter for other preprocedural examination: Secondary | ICD-10-CM

## 2023-08-20 DIAGNOSIS — K429 Umbilical hernia without obstruction or gangrene: Secondary | ICD-10-CM | POA: Diagnosis not present

## 2023-08-20 DIAGNOSIS — K432 Incisional hernia without obstruction or gangrene: Secondary | ICD-10-CM | POA: Diagnosis not present

## 2023-08-20 DIAGNOSIS — Z8249 Family history of ischemic heart disease and other diseases of the circulatory system: Secondary | ICD-10-CM | POA: Diagnosis not present

## 2023-08-20 LAB — CBC WITH DIFFERENTIAL/PLATELET
Abs Immature Granulocytes: 0.02 10*3/uL (ref 0.00–0.07)
Basophils Absolute: 0.1 10*3/uL (ref 0.0–0.1)
Basophils Relative: 1 %
Eosinophils Absolute: 0.2 10*3/uL (ref 0.0–0.5)
Eosinophils Relative: 2 %
HCT: 43 % (ref 36.0–46.0)
Hemoglobin: 13.1 g/dL (ref 12.0–15.0)
Immature Granulocytes: 0 %
Lymphocytes Relative: 24 %
Lymphs Abs: 2.2 10*3/uL (ref 0.7–4.0)
MCH: 24.3 pg — ABNORMAL LOW (ref 26.0–34.0)
MCHC: 30.5 g/dL (ref 30.0–36.0)
MCV: 79.8 fL — ABNORMAL LOW (ref 80.0–100.0)
Monocytes Absolute: 0.7 10*3/uL (ref 0.1–1.0)
Monocytes Relative: 8 %
Neutro Abs: 5.8 10*3/uL (ref 1.7–7.7)
Neutrophils Relative %: 65 %
Platelets: 214 10*3/uL (ref 150–400)
RBC: 5.39 MIL/uL — ABNORMAL HIGH (ref 3.87–5.11)
RDW: 14.7 % (ref 11.5–15.5)
WBC: 9.1 10*3/uL (ref 4.0–10.5)
nRBC: 0 % (ref 0.0–0.2)

## 2023-08-20 LAB — BASIC METABOLIC PANEL
Anion gap: 10 (ref 5–15)
BUN: 8 mg/dL (ref 6–20)
CO2: 20 mmol/L — ABNORMAL LOW (ref 22–32)
Calcium: 9.2 mg/dL (ref 8.9–10.3)
Chloride: 107 mmol/L (ref 98–111)
Creatinine, Ser: 0.78 mg/dL (ref 0.44–1.00)
GFR, Estimated: >60 mL/min (ref >60)
Glucose, Bld: 58 mg/dL — ABNORMAL LOW (ref 70–99)
Potassium: 3.4 mmol/L — ABNORMAL LOW (ref 3.5–5.1)
Sodium: 137 mmol/L (ref 135–145)

## 2023-08-20 LAB — HCG, QUANTITATIVE, PREGNANCY: hCG, Beta Chain, Quant, S: <1 m[IU]/mL (ref <5)

## 2023-08-22 ENCOUNTER — Ambulatory Visit (HOSPITAL_COMMUNITY): Payer: BC Managed Care – PPO | Admitting: Anesthesiology

## 2023-08-22 ENCOUNTER — Ambulatory Visit (HOSPITAL_COMMUNITY)
Admission: RE | Admit: 2023-08-22 | Discharge: 2023-08-22 | Disposition: A | Discharge status: Home / Self Care | Payer: BC Managed Care – PPO | Attending: Surgery | Admitting: Surgery

## 2023-08-22 ENCOUNTER — Other Ambulatory Visit: Payer: Self-pay

## 2023-08-22 ENCOUNTER — Encounter (HOSPITAL_COMMUNITY): Payer: Self-pay | Admitting: Surgery

## 2023-08-22 ENCOUNTER — Encounter (HOSPITAL_COMMUNITY)
Admission: RE | Disposition: A | Discharge status: Home / Self Care | Payer: Self-pay | Source: Home / Self Care | Attending: Surgery

## 2023-08-22 DIAGNOSIS — Z87891 Personal history of nicotine dependence: Secondary | ICD-10-CM | POA: Insufficient documentation

## 2023-08-22 DIAGNOSIS — I10 Essential (primary) hypertension: Secondary | ICD-10-CM | POA: Diagnosis not present

## 2023-08-22 DIAGNOSIS — K432 Incisional hernia without obstruction or gangrene: Secondary | ICD-10-CM

## 2023-08-22 DIAGNOSIS — Z8249 Family history of ischemic heart disease and other diseases of the circulatory system: Secondary | ICD-10-CM | POA: Insufficient documentation

## 2023-08-22 DIAGNOSIS — K429 Umbilical hernia without obstruction or gangrene: Secondary | ICD-10-CM | POA: Diagnosis not present

## 2023-08-22 LAB — GLUCOSE, CAPILLARY: Glucose-Capillary: 106 mg/dL — ABNORMAL HIGH (ref 70–99)

## 2023-08-22 SURGERY — REPAIR, HERNIA, UMBILICAL, ROBOT-ASSISTED
Anesthesia: General | Site: Abdomen

## 2023-08-22 MED ORDER — CHLORHEXIDINE GLUCONATE 0.12 % MT SOLN
OROMUCOSAL | Status: AC
  Administered 2023-08-22: 15 mL via OROMUCOSAL
  Filled 2023-08-22: qty 15

## 2023-08-22 MED ORDER — CHLORHEXIDINE GLUCONATE 0.12 % MT SOLN: 15.0000 mL | Freq: Once | OROMUCOSAL | Status: AC

## 2023-08-22 MED ORDER — GLYCOPYRROLATE 0.2 MG/ML IJ SOLN
INTRAMUSCULAR | Status: DC | PRN
  Administered 2023-08-22: .2 mg via INTRAVENOUS

## 2023-08-22 MED ORDER — PROPOFOL 10 MG/ML IV BOLUS
INTRAVENOUS | Status: AC
Start: 2023-08-22 — End: ?
  Filled 2023-08-22: qty 20

## 2023-08-22 MED ORDER — ESMOLOL HCL 100 MG/10ML IV SOLN
INTRAVENOUS | Status: DC | PRN
  Administered 2023-08-22: 10 mg via INTRAVENOUS
  Administered 2023-08-22: 20 mg via INTRAVENOUS

## 2023-08-22 MED ORDER — ORAL CARE MOUTH RINSE: 15.0000 mL | Freq: Once | OROMUCOSAL | Status: AC

## 2023-08-22 MED ORDER — ONDANSETRON HCL 4 MG/2ML IJ SOLN: 4.0000 mg | Freq: Once | INTRAMUSCULAR | Status: DC | PRN

## 2023-08-22 MED ORDER — DEXAMETHASONE SODIUM PHOSPHATE 4 MG/ML IJ SOLN
INTRAMUSCULAR | Status: DC | PRN
Start: 2023-08-22 — End: 2023-08-22
  Administered 2023-08-22: 4 mg via INTRAVENOUS

## 2023-08-22 MED ORDER — FENTANYL CITRATE (PF) 100 MCG/2ML IJ SOLN
INTRAMUSCULAR | Status: AC
  Filled 2023-08-22: qty 2

## 2023-08-22 MED ORDER — OXYCODONE HCL 5 MG PO TABS
5.0000 mg | ORAL_TABLET | Freq: Once | ORAL | Status: AC | PRN
  Administered 2023-08-22: 5 mg via ORAL
  Filled 2023-08-22 (×2): qty 1

## 2023-08-22 MED ORDER — DEXAMETHASONE SODIUM PHOSPHATE 10 MG/ML IJ SOLN
INTRAMUSCULAR | Status: AC
Start: 2023-08-22 — End: ?
  Filled 2023-08-22: qty 1

## 2023-08-22 MED ORDER — PROPOFOL 10 MG/ML IV BOLUS
INTRAVENOUS | Status: AC
  Filled 2023-08-22: qty 20

## 2023-08-22 MED ORDER — HYDRALAZINE HCL 20 MG/ML IJ SOLN
INTRAMUSCULAR | Status: DC | PRN
  Administered 2023-08-22: 2 mg via INTRAVENOUS

## 2023-08-22 MED ORDER — MIDAZOLAM HCL 2 MG/2ML IJ SOLN
INTRAMUSCULAR | Status: AC
Start: 2023-08-22 — End: ?
  Filled 2023-08-22: qty 2

## 2023-08-22 MED ORDER — OXYCODONE HCL 5 MG/5ML PO SOLN: 5.0000 mg | Freq: Once | ORAL | Status: AC | PRN

## 2023-08-22 MED ORDER — DEXMEDETOMIDINE HCL IN NACL 80 MCG/20ML IV SOLN
INTRAVENOUS | Status: AC
Start: 2023-08-22 — End: ?
  Filled 2023-08-22: qty 20

## 2023-08-22 MED ORDER — ONDANSETRON HCL 4 MG/2ML IJ SOLN
INTRAMUSCULAR | Status: DC | PRN
  Administered 2023-08-22: 4 mg via INTRAVENOUS

## 2023-08-22 MED ORDER — EPHEDRINE 5 MG/ML INJ
INTRAVENOUS | Status: AC
  Filled 2023-08-22: qty 5

## 2023-08-22 MED ORDER — CEFAZOLIN SODIUM-DEXTROSE 2-4 GM/100ML-% IV SOLN
INTRAVENOUS | Status: AC
  Filled 2023-08-22: qty 100

## 2023-08-22 MED ORDER — ONDANSETRON HCL 4 MG/2ML IJ SOLN
INTRAMUSCULAR | Status: AC
  Filled 2023-08-22: qty 2

## 2023-08-22 MED ORDER — LIDOCAINE HCL (PF) 2 % IJ SOLN
INTRAMUSCULAR | Status: AC
  Filled 2023-08-22: qty 5

## 2023-08-22 MED ORDER — FENTANYL CITRATE (PF) 100 MCG/2ML IJ SOLN
INTRAMUSCULAR | Status: DC | PRN
  Administered 2023-08-22 (×3): 50 ug via INTRAVENOUS
  Administered 2023-08-22: 100 ug via INTRAVENOUS

## 2023-08-22 MED ORDER — BUPIVACAINE HCL (PF) 0.5 % IJ SOLN
INTRAMUSCULAR | Status: AC
  Filled 2023-08-22: qty 30

## 2023-08-22 MED ORDER — KETOROLAC TROMETHAMINE 30 MG/ML IJ SOLN
INTRAMUSCULAR | Status: AC
Start: 2023-08-22 — End: ?
  Filled 2023-08-22: qty 1

## 2023-08-22 MED ORDER — FENTANYL CITRATE PF 50 MCG/ML IJ SOSY
25.0000 ug | PREFILLED_SYRINGE | INTRAMUSCULAR | Status: DC | PRN
  Administered 2023-08-22 (×3): 50 ug via INTRAVENOUS
  Filled 2023-08-22 (×3): qty 1

## 2023-08-22 MED ORDER — BUPIVACAINE HCL (PF) 0.5 % IJ SOLN
INTRAMUSCULAR | Status: DC | PRN
  Administered 2023-08-22: 30 mL

## 2023-08-22 MED ORDER — OXYCODONE HCL 5 MG PO TABS: 5.0000 mg | ORAL_TABLET | Freq: Four times a day (QID) | ORAL | 0 refills | Status: AC | PRN

## 2023-08-22 MED ORDER — SUCCINYLCHOLINE CHLORIDE 200 MG/10ML IV SOSY
PREFILLED_SYRINGE | INTRAVENOUS | Status: AC
Start: 2023-08-22 — End: ?
  Filled 2023-08-22: qty 10

## 2023-08-22 MED ORDER — IBUPROFEN 800 MG PO TABS: 800.0000 mg | ORAL_TABLET | Freq: Four times a day (QID) | ORAL | 0 refills | Status: DC

## 2023-08-22 MED ORDER — CHLORHEXIDINE GLUCONATE CLOTH 2 % EX PADS
6.0000 | MEDICATED_PAD | Freq: Once | CUTANEOUS | Status: AC
  Administered 2023-08-22: 6 via TOPICAL

## 2023-08-22 MED ORDER — PROPOFOL 10 MG/ML IV BOLUS
INTRAVENOUS | Status: DC | PRN
  Administered 2023-08-22: 50 mg via INTRAVENOUS
  Administered 2023-08-22: 150 mg via INTRAVENOUS

## 2023-08-22 MED ORDER — ROCURONIUM BROMIDE 10 MG/ML (PF) SYRINGE
PREFILLED_SYRINGE | INTRAVENOUS | Status: AC
Start: 2023-08-22 — End: ?
  Filled 2023-08-22: qty 10

## 2023-08-22 MED ORDER — DOCUSATE SODIUM 100 MG PO CAPS: 100.0000 mg | ORAL_CAPSULE | Freq: Two times a day (BID) | ORAL | 2 refills | Status: AC

## 2023-08-22 MED ORDER — CHLORHEXIDINE GLUCONATE CLOTH 2 % EX PADS
6.0000 | MEDICATED_PAD | Freq: Once | CUTANEOUS | Status: DC
  Administered 2023-08-22: 6 via TOPICAL

## 2023-08-22 MED ORDER — LACTATED RINGERS IV SOLN: INTRAVENOUS | Status: DC

## 2023-08-22 MED ORDER — ROCURONIUM BROMIDE 10 MG/ML (PF) SYRINGE
PREFILLED_SYRINGE | INTRAVENOUS | Status: DC | PRN
  Administered 2023-08-22: 20 mg via INTRAVENOUS
  Administered 2023-08-22: 50 mg via INTRAVENOUS
  Administered 2023-08-22: 10 mg via INTRAVENOUS

## 2023-08-22 MED ORDER — STERILE WATER FOR IRRIGATION IR SOLN
Status: DC | PRN
  Administered 2023-08-22: 500 mL

## 2023-08-22 MED ORDER — SUGAMMADEX SODIUM 200 MG/2ML IV SOLN
INTRAVENOUS | Status: DC | PRN
  Administered 2023-08-22: 200 mg via INTRAVENOUS

## 2023-08-22 MED ORDER — GLYCOPYRROLATE PF 0.2 MG/ML IJ SOSY
PREFILLED_SYRINGE | INTRAMUSCULAR | Status: AC
Start: 2023-08-22 — End: ?
  Filled 2023-08-22: qty 1

## 2023-08-22 MED ORDER — MIDAZOLAM HCL 5 MG/5ML IJ SOLN
INTRAMUSCULAR | Status: DC | PRN
  Administered 2023-08-22: 2 mg via INTRAVENOUS

## 2023-08-22 MED ORDER — LIDOCAINE HCL (CARDIAC) PF 100 MG/5ML IV SOSY
PREFILLED_SYRINGE | INTRAVENOUS | Status: DC | PRN
Start: 2023-08-22 — End: 2023-08-22
  Administered 2023-08-22: 100 mg via INTRAVENOUS

## 2023-08-22 MED ORDER — CEFAZOLIN SODIUM-DEXTROSE 2-4 GM/100ML-% IV SOLN
2.0000 g | INTRAVENOUS | Status: AC
  Administered 2023-08-22: 2 g via INTRAVENOUS

## 2023-08-22 SURGICAL SUPPLY — 47 items
BLADE SURG 15 STRL LF DISP TIS (BLADE) ×1 IMPLANT
CHLORAPREP W/TINT 26 (MISCELLANEOUS) ×1 IMPLANT
COVER LIGHT HANDLE STERIS (MISCELLANEOUS) ×2 IMPLANT
COVER MAYO STAND XLG (MISCELLANEOUS) ×1 IMPLANT
COVER TIP SHEARS 8 DVNC (MISCELLANEOUS) ×1 IMPLANT
DEFOGGER SCOPE WARMER CLEARIFY (MISCELLANEOUS) ×1 IMPLANT
DERMABOND ADVANCED .7 DNX12 (GAUZE/BANDAGES/DRESSINGS) ×1 IMPLANT
DRAPE ARM DVNC X/XI (DISPOSABLE) ×3 IMPLANT
DRAPE COLUMN DVNC XI (DISPOSABLE) ×1 IMPLANT
DRIVER NDL MEGA SUTCUT DVNCXI (INSTRUMENTS) IMPLANT
DRIVER NDLE MEGA SUTCUT DVNCXI (INSTRUMENTS) ×1 IMPLANT
DRSG TEGADERM 4X4.75 (GAUZE/BANDAGES/DRESSINGS) ×1 IMPLANT
ELECT REM PT RETURN 9FT ADLT (ELECTROSURGICAL) ×1
ELECTRODE REM PT RTRN 9FT ADLT (ELECTROSURGICAL) ×1 IMPLANT
FORCEPS BPLR 8 MD DVNC XI (FORCEP) IMPLANT
GAUZE SPONGE 2X2 8PLY STRL LF (GAUZE/BANDAGES/DRESSINGS) ×2 IMPLANT
GLOVE BIOGEL M 7.0 STRL (GLOVE) IMPLANT
GLOVE BIOGEL PI IND STRL 6.5 (GLOVE) ×2 IMPLANT
GLOVE BIOGEL PI IND STRL 7.0 (GLOVE) ×2 IMPLANT
GLOVE SURG SS PI 6.5 STRL IVOR (GLOVE) ×2 IMPLANT
GOWN STRL REUS W/TWL LRG LVL3 (GOWN DISPOSABLE) ×3 IMPLANT
KIT TURNOVER KIT A (KITS) ×1 IMPLANT
MANIFOLD NEPTUNE II (INSTRUMENTS) ×1 IMPLANT
MESH VENTRALIGHT ST 4.5IN (Mesh General) IMPLANT
NDL HYPO 21X1.5 SAFETY (NEEDLE) ×1 IMPLANT
NDL INSUFFLATION 14GA 120MM (NEEDLE) ×1 IMPLANT
NEEDLE HYPO 21X1.5 SAFETY (NEEDLE) ×1 IMPLANT
NEEDLE INSUFFLATION 14GA 120MM (NEEDLE) ×1 IMPLANT
OBTURATOR OPTICAL STND 8 DVNC (TROCAR) ×1
OBTURATOR OPTICALSTD 8 DVNC (TROCAR) ×1 IMPLANT
PACK LAP CHOLE LZT030E (CUSTOM PROCEDURE TRAY) ×1 IMPLANT
PENCIL HANDSWITCHING (ELECTRODE) ×1 IMPLANT
POSITIONER HEAD 8X9X4 ADT (SOFTGOODS) ×1 IMPLANT
SCISSORS MNPLR CVD DVNC XI (INSTRUMENTS) ×1 IMPLANT
SEAL UNIV 5-12 XI (MISCELLANEOUS) ×3 IMPLANT
SET BASIN LINEN APH (SET/KITS/TRAYS/PACK) ×1 IMPLANT
SET TUBE DA VINCI INSUFFLATOR (TUBING) IMPLANT
SUT MNCRL AB 4-0 PS2 18 (SUTURE) ×1 IMPLANT
SUT STRATAFIX 0 PDS+ CT-2 23 (SUTURE) ×2
SUT V-LOC 90 ABS 3-0 VLT V-20 (SUTURE) ×2 IMPLANT
SUTURE STRATFX 0 PDS+ CT-2 23 (SUTURE) ×1 IMPLANT
SYR 30ML LL (SYRINGE) ×1 IMPLANT
SYS RETRIEVAL 5MM INZII UNIV (BASKET) ×1
SYSTEM RETRIEVL 5MM INZII UNIV (BASKET) IMPLANT
TAPE TRANSPORE STRL 2 31045 (GAUZE/BANDAGES/DRESSINGS) ×1 IMPLANT
TRAY FOLEY W/BAG SLVR 16FR ST (SET/KITS/TRAYS/PACK) ×1 IMPLANT
WATER STERILE IRR 500ML POUR (IV SOLUTION) ×1 IMPLANT

## 2023-08-22 NOTE — Discharge Instructions (Signed)
Ambulatory Surgery Discharge Instructions  General Anesthesia or Sedation Do not drive or operate heavy machinery for 24 hours.  Do not consume alcohol, tranquilizers, sleeping medications, or any non-prescribed medications for 24 hours. Do not make important decisions or sign any important papers in the next 24 hours. You should have someone with you tonight at home.  Activity  You are advised to go directly home from the hospital.  Restrict your activities and rest for a day.  Resume light activity tomorrow. No heavy lifting over 10 lbs or strenuous exercise. Wear your abdominal binder at all times except while sleeping and bathing for the next 2 weeks.  Fluids and Diet Begin with clear liquids, bouillon, dry toast, soda crackers.  If not nauseated, you may go to a regular diet when you desire.  Greasy and spicy foods are not advised.  Medications  If you have not had a bowel movement in 24 hours, take 2 tablespoons over the counter Milk of mag.             You May resume your blood thinners tomorrow (Aspirin, coumadin, or other).  You are being discharged with prescriptions for Opioid/Narcotic Medications: There are some specific considerations for these medications that you should know. Opioid Meds have risks & benefits. Addiction to these meds is always a concern with prolonged use Take medication only as directed Do not drive while taking narcotic pain medication Do not crush tablets or capsules Do not use a different container than medication was dispensed in Lock the container of medication in a cool, dry place out of reach of children and pets. Opioid medication can cause addiction Do not share with anyone else (this is a felony) Do not store medications for future use. Dispose of them properly.     Disposal:  Find a [employer] household drug take back site near you.  If you can't get to a drug take back site, use the recipe below as a last resort to dispose of expired,  unused or unwanted drugs. Disposal  (Do not dispose chemotherapy drugs this way, talk to your prescribing doctor instead.) Step 1: Mix drugs (do not crush) with dirt, kitty litter, or used coffee grounds and add a small amount of water to dissolve any solid medications. Step 2: Seal drugs in plastic bag. Step 3: Place plastic bag in trash. Step 4: Take prescription container and scratch out personal information, then recycle or throw away.  Operative Site  You have a liquid bandage over your incisions, this will begin to flake off in about a week. Ok to English as a second language teacher. Keep wound clean and dry. No baths or swimming. No lifting more than 10 pounds.  Contact Information: If you have questions or concerns, please call our office, [phone], Monday- Thursday 8AM-5PM and Friday 8AM-12Noon.  If it is after hours or on the weekend, please call [facility], [phone], and ask to speak to the surgeon on call for Dr. Robyne Peers at [facility].   SPECIFIC COMPLICATIONS TO WATCH FOR: Inability to urinate Fever over 101? F by mouth Nausea and vomiting lasting longer than 24 hours. Pain not relieved by medication ordered Swelling around the operative site Increased redness, warmth, hardness, around operative area Numbness, tingling, or cold fingers or toes Blood -soaked dressing, (small amounts of oozing may be normal) Increasing and progressive drainage from surgical area or exam site

## 2023-08-22 NOTE — Anesthesia Postprocedure Evaluation (Signed)
Anesthesia Post Note  Patient: Katherine Ward  Procedure(s) Performed: XI ROBOT ASSISTED UMBILICAL HERNIA REPAIR W/ MESH (Abdomen)  Patient location during evaluation: Phase II Anesthesia Type: General Level of consciousness: awake Pain management: pain level controlled Vital Signs Assessment: post-procedure vital signs reviewed and stable Respiratory status: spontaneous breathing and respiratory function stable Cardiovascular status: blood pressure returned to baseline and stable Postop Assessment: no headache and no apparent nausea or vomiting Anesthetic complications: no Comments: Late entry   No notable events documented.   Last Vitals:  Vitals:   08/22/23 1015 08/22/23 1020  BP: (!) 128/92   Pulse: 79 66  Resp: 16 16  Temp:    SpO2: 94% 95%    Last Pain:  Vitals:   08/22/23 1020  TempSrc:   PainSc: 10-Worst pain ever                 Windell Norfolk

## 2023-08-22 NOTE — Interval H&P Note (Signed)
History and Physical Interval Note:  08/22/2023 7:27 AM  Katherine Ward  has presented today for surgery, with the diagnosis of RECURRENT UMBILICAL HERNIA 2 CM.  The various methods of treatment have been discussed with the patient and family. After consideration of risks, benefits and other options for treatment, the patient has consented to  Procedure(s): XI ROBOT ASSISTED UMBILICAL HERNIA REPAIR W/ MESH (N/A) as a surgical intervention.  The patient's history has been reviewed, patient examined, no change in status, stable for surgery.  I have reviewed the patient's chart and labs.  Questions were answered to the patient's satisfaction.     Jerzy Crotteau A Bryella Diviney

## 2023-08-22 NOTE — Anesthesia Preprocedure Evaluation (Signed)
Anesthesia Evaluation  Patient identified by MRN, date of birth, ID band Patient awake    Reviewed: Allergy & Precautions, H&P , NPO status , Patient's Chart, lab work & pertinent test results, reviewed documented beta blocker date and time   Airway Mallampati: II  TM Distance: >3 FB Neck ROM: full    Dental no notable dental hx.    Pulmonary neg pulmonary ROS, former smoker   Pulmonary exam normal breath sounds clear to auscultation       Cardiovascular Exercise Tolerance: Good hypertension,  Rhythm:regular Rate:Normal     Neuro/Psych  PSYCHIATRIC DISORDERS  Depression    negative neurological ROS     GI/Hepatic negative GI ROS, Neg liver ROS,,,  Endo/Other  negative endocrine ROS    Renal/GU negative Renal ROS  negative genitourinary   Musculoskeletal   Abdominal   Peds  Hematology negative hematology ROS (+)   Anesthesia Other Findings   Reproductive/Obstetrics negative OB ROS                             Anesthesia Physical Anesthesia Plan  ASA: 2  Anesthesia Plan: General and General ETT   Post-op Pain Management:    Induction:   PONV Risk Score and Plan: Ondansetron  Airway Management Planned:   Additional Equipment:   Intra-op Plan:   Post-operative Plan:   Informed Consent: I have reviewed the patients History and Physical, chart, labs and discussed the procedure including the risks, benefits and alternatives for the proposed anesthesia with the patient or authorized representative who has indicated his/her understanding and acceptance.     Dental Advisory Given  Plan Discussed with: CRNA  Anesthesia Plan Comments:        Anesthesia Quick Evaluation

## 2023-08-22 NOTE — Addendum Note (Signed)
Addendum  created 08/22/23 1113 by Marquis Buggy, CRNA   Flowsheet accepted

## 2023-08-22 NOTE — Transfer of Care (Signed)
Immediate Anesthesia Transfer of Care Note  Patient: Katherine Ward  Procedure(s) Performed: XI ROBOT ASSISTED UMBILICAL HERNIA REPAIR W/ MESH (Abdomen)  Patient Location: PACU  Anesthesia Type:General  Level of Consciousness: awake, alert , and oriented  Airway & Oxygen Therapy: Patient Spontanous Breathing, Patient connected to nasal cannula oxygen, and Patient connected to face mask oxygen  Post-op Assessment: Report given to RN  Post vital signs: Reviewed and stable  Last Vitals:  Vitals Value Taken Time  BP 142/81 08/22/23 1000  Temp 37 C 08/22/23 1000  Pulse 62 08/22/23 1003  Resp 15 08/22/23 1003  SpO2 100 % 08/22/23 1003  Vitals shown include unfiled device data.  Last Pain:  Vitals:   08/22/23 1000  TempSrc: Tympanic  PainSc: 5          Complications: No notable events documented.

## 2023-08-22 NOTE — Anesthesia Procedure Notes (Signed)
Procedure Name: Intubation Date/Time: 08/22/2023 7:45 AM  Performed by: Marquis Buggy, CRNAPre-anesthesia Checklist: Patient identified, Emergency Drugs available, Suction available and Patient being monitored Patient Re-evaluated:Patient Re-evaluated prior to induction Oxygen Delivery Method: Circle system utilized and Non-rebreather mask Preoxygenation: Pre-oxygenation with 100% oxygen Induction Type: IV induction Ventilation: Mask ventilation without difficulty Laryngoscope Size: Mac and 3 Grade View: Grade I Tube type: Oral Tube size: 7.0 mm Number of attempts: 1 Airway Equipment and Method: Stylet Placement Confirmation: ETT inserted through vocal cords under direct vision, positive ETCO2, CO2 detector and breath sounds checked- equal and bilateral Secured at: 21 cm Dental Injury: Teeth and Oropharynx as per pre-operative assessment  Comments: Attempt x 1 by EMS student L.Wilson with esophageal intubation. ETT removed and pt masked. VSS with spO2 100% entire time. Attempt x1 by CRNA with grade I view. Teeth noted in preop in very poor dentition. Teeth remains in pre-op condition.

## 2023-08-22 NOTE — Op Note (Signed)
[facility] Operative Note  08/22/23  Preoperative Diagnosis: Recurrent umbilical hernia   Postoperative Diagnosis: Recurrent ventral hernia x3   Procedure(s) Performed: Robotic assisted laparoscopic recurrent ventral hernia x3 with mesh, total defect size 5 cm (2 hernia defects each 2 cm, 1 hernia defect 1 cm)   Surgeon: Theophilus Kinds, DO   Assistants: No qualified resident was available    Anesthesia: General endotracheal   Anesthesiologist: Windell Norfolk, MD    Specimens: None   Estimated Blood Loss: Minimal   Blood Replacement: None    Complications: None   Wound Class: Clean   Operative Indications: Patient is a 47 year old female who presents for repair of recurrent umbilical hernia.  She first noted that the area recurred about 3 years ago and has been having increased pain associated with the hernia.  She is agreeable to surgery at this time.  All risks and benefits of performing this procedure were discussed with the patient including pain, infection, bleeding, damage to the surrounding structures, hernia recurrence, and need for more procedures or surgery. The patient voiced understanding of the procedure, all questions were sought and answered, and consent was obtained.  Findings: Ventral hernia x3, total defect size of 5 cm (Two hernias each 2 cm in size, One hernia 1 cm in size) Tension free repair achieved with Ventralight 4.5 inch mesh and suture Adequate hemostasis    Procedure: The patient was brought to the operating room and general anesthesia was induced. A time-out was completed verifying correct patient, procedure, site, positioning, and implant(s) and/or special equipment prior to beginning this procedure. Antibiotics were administered prior to making the incision. SCDs placed. The anterior abdominal wall was prepped and draped in the standard sterile fashion.   A stab incision was made at Palmer's point. A towel clip was used  to elevate the abdominal wall. A Veress needle placed and the saline drop test was performed. The abdomen was insufflated to 15cm without issues. Lateral ports on the left side were placed using the optiview ports, placing the 8mm port at Palmer's point first and two addition 8mm ports on the left side of the abdomen. The [place] robot then docked into place.  The hernia was noted to be at the umbilicus. The hernia contents noted and reduced with combination of blunt, sharp dissection with scissors and fenestrated forceps.  Hemostasis achieved throughout this portion.  Any fat overlying the peritoneal layer was taken down creating flaps to clear the space for adequate fixation of the mesh.  During removal of the overlying fat, there were 3 different hernia defects noted; one measuring 1 cm in size, and two each measuring 2 cm in size.  The 4.5 inch Ventralight mesh and 0 Stratafix, and three 3-0 V-loc sutures were placed in the abdominal cavity under direct visualization.   A transfacial suture with 0 stratafix used to primarily close defects under minimal tension. The stratafix was then passed through the center of the mesh (coated side out) to secured the mesh to the abdominal wall centered over the defect.  The mesh was then circumferentially sutured into the anterior abdominal wall using 3-0 VLock x3.  Any bleeding noted during this portion was no longer actively bleeding by end of securing mesh and tightening the suture.    The robot was undocked.  The abdomen was then de-sufflated and the ports were removed.  All skin incisions closed with subcuticular 4-0 Monocryl and dermabond. Final inspection revealed acceptable hemostasis.   All  counts were correct at the end of the case. The patient was awakened from anesthesia and extubated without complication.  The patient went to the PACU in stable condition.   Theophilus Kinds, DO [facility] [address] [place], [place]  [address] [phone] (office)

## 2023-08-22 NOTE — Progress Notes (Signed)
[facility]  Spoke with the patient's mother in the consultation room.  I explained that she tolerated the procedure without difficulty.  She has dissolvable stitches under the skin with overlying skin glue.  This will flake off in 10 to 14 days.  I discharged her home with a prescription for narcotic pain medication that they should take as needed for pain.  I also want her taking scheduled Ibuprofen.  If they take the narcotic pain medication, they should take a stool softener as well.  The patient will follow-up with me in 2 weeks.  All questions were answered to her expressed satisfaction.  Theophilus Kinds, DO [facility] [address] [place] [address] [place], [place] [address] [phone] (office)

## 2023-09-05 ENCOUNTER — Encounter: Payer: Self-pay | Admitting: Surgery

## 2023-09-05 ENCOUNTER — Ambulatory Visit (INDEPENDENT_AMBULATORY_CARE_PROVIDER_SITE_OTHER): Payer: BC Managed Care – PPO | Admitting: Surgery

## 2023-09-05 VITALS — BP 188/121 | HR 74 | Temp 97.6°F | Resp 14 | Ht 64.0 in | Wt 172.0 lb

## 2023-09-05 DIAGNOSIS — I10 Essential (primary) hypertension: Secondary | ICD-10-CM

## 2023-09-05 DIAGNOSIS — Z09 Encounter for follow-up examination after completed treatment for conditions other than malignant neoplasm: Secondary | ICD-10-CM

## 2023-09-05 NOTE — Progress Notes (Addendum)
[facility] Note   HPI:  47 y.o. Female presents to clinic for post-op follow-up status post robotic assisted laparoscopic repair of recurrent ventral hernia x 3 with mesh on 11/21.  Patient states that she has been doing well since the surgery.  She is tolerating a diet without nausea and vomiting, moving her bowels without issue.  She is not currently taking any pain medications.  She took all of her narcotic pain medications and only used ibuprofen once after she completed them.  She denies fevers and chills.  Review of Systems:  All other review of systems: otherwise negative   Vital Signs:  BP (!) 188/121   Pulse 74   Temp 97.6 F (36.4 C) (Oral)   Resp 14   Ht 5\' 4"  (1.626 m)   Wt 172 lb (78 kg)   LMP 07/21/2023 Comment: 08/20/23 negative serum preg.  SpO2 98%   BMI 29.52 kg/m    Physical Exam:  Physical Exam Vitals reviewed.  Constitutional:      Appearance: Normal appearance.  Abdominal:     Comments: Abdomen soft, nondistended, no percussion tenderness, nontender to palpation; no rigidity, guarding, rebound tenderness; incisions healing well with skin glue still in place  Neurological:     Mental Status: She is alert.     Laboratory studies: None  Imaging:  None  Assessment:  47 y.o. yo Female who presents for follow-up status post robotic assisted laparoscopic recurrent ventral hernia repair x 3 with mesh on 11/21.  Plan:  -Patient overall doing well.  Tolerating a diet, moving her bowels, and pain well-controlled -Advised that she needs to take it easy for an additional 2 weeks with no heavy lifting more than anything greater than 10 pounds -Advised that she can use antibiotic ointment prior to showering to help loosen the remaining skin glue -Work note provided stating that she can return to work on Monday 12/9 with light duty and that she may return to full duty on 12/23 -Follow up with me as needed -Will reach out to the patient's PCP  for follow-up given significant hypertension during this visit  All of the above recommendations were discussed with the patient, and all of patient's questions were answered to her expressed satisfaction.  Theophilus Kinds, DO [facility] [address] [place], [place] [address] [phone] (office)

## 2023-09-06 ENCOUNTER — Telehealth: Payer: Self-pay | Admitting: *Deleted

## 2023-09-06 DIAGNOSIS — I10 Essential (primary) hypertension: Secondary | ICD-10-CM | POA: Diagnosis not present

## 2023-09-06 NOTE — Telephone Encounter (Signed)
-----   Message from CATHERINE A PAPPAYLIOU sent at 09/06/2023 10:53 AM EST ----- Regarding: Follow-up with PCP Can we please send a message to this patient's PCP for her to follow-up with them? Her blood pressure was very elevated at her visit yesterday.  Thanks, [employer]

## 2023-09-06 NOTE — Telephone Encounter (Signed)
    09/05/2023    1:00 PM 08/22/2023   11:37 AM 08/22/2023   11:15 AM  Vitals with BMI  Height 5\' 4"     Weight 172 lbs    BMI 29.51    Systolic 188 138 161  Diastolic 121 79 95  Pulse 74 59    Call placed to [facility] [phone]~ telephone.   Advised that patient was seen for Pre Op appointment and vital signs were noted as above.   Was advised that patient has not been seen at PCP office in >6 months. Advised PCP office to call and schedule follow up with patient if she has not been dismissed from the practice. Verbalized understanding.

## 2023-10-08 ENCOUNTER — Emergency Department (HOSPITAL_COMMUNITY)
Admission: EM | Admit: 2023-10-08 | Discharge: 2023-10-08 | Discharge status: Against Medical Advice | Payer: BC Managed Care – PPO | Attending: Emergency Medicine | Admitting: Emergency Medicine

## 2023-10-08 ENCOUNTER — Encounter (HOSPITAL_COMMUNITY): Payer: Self-pay

## 2023-10-08 ENCOUNTER — Emergency Department (HOSPITAL_COMMUNITY)
Admission: EM | Admit: 2023-10-08 | Discharge: 2023-10-08 | Disposition: A | Discharge status: Home / Self Care | Payer: BC Managed Care – PPO | Source: Home / Self Care | Attending: Emergency Medicine | Admitting: Emergency Medicine

## 2023-10-08 ENCOUNTER — Emergency Department (HOSPITAL_COMMUNITY): Payer: BC Managed Care – PPO

## 2023-10-08 DIAGNOSIS — Z79899 Other long term (current) drug therapy: Secondary | ICD-10-CM | POA: Insufficient documentation

## 2023-10-08 DIAGNOSIS — I10 Essential (primary) hypertension: Secondary | ICD-10-CM | POA: Insufficient documentation

## 2023-10-08 DIAGNOSIS — Y838 Other surgical procedures as the cause of abnormal reaction of the patient, or of later complication, without mention of misadventure at the time of the procedure: Secondary | ICD-10-CM | POA: Diagnosis not present

## 2023-10-08 DIAGNOSIS — T889XXA Complication of surgical and medical care, unspecified, initial encounter: Secondary | ICD-10-CM | POA: Diagnosis not present

## 2023-10-08 DIAGNOSIS — N200 Calculus of kidney: Secondary | ICD-10-CM | POA: Insufficient documentation

## 2023-10-08 DIAGNOSIS — Z9889 Other specified postprocedural states: Secondary | ICD-10-CM | POA: Diagnosis not present

## 2023-10-08 DIAGNOSIS — Z5321 Procedure and treatment not carried out due to patient leaving prior to being seen by health care provider: Secondary | ICD-10-CM | POA: Insufficient documentation

## 2023-10-08 DIAGNOSIS — R109 Unspecified abdominal pain: Secondary | ICD-10-CM | POA: Insufficient documentation

## 2023-10-08 DIAGNOSIS — R1031 Right lower quadrant pain: Secondary | ICD-10-CM | POA: Diagnosis not present

## 2023-10-08 LAB — COMPREHENSIVE METABOLIC PANEL
ALT: 14 U/L (ref 0–44)
AST: 19 U/L (ref 15–41)
Albumin: 4 g/dL (ref 3.5–5.0)
Alkaline Phosphatase: 50 U/L (ref 38–126)
Anion gap: 8 (ref 5–15)
BUN: 7 mg/dL (ref 6–20)
CO2: 21 mmol/L — ABNORMAL LOW (ref 22–32)
Calcium: 9.2 mg/dL (ref 8.9–10.3)
Chloride: 108 mmol/L (ref 98–111)
Creatinine, Ser: 0.77 mg/dL (ref 0.44–1.00)
GFR, Estimated: >60 mL/min (ref >60)
Glucose, Bld: 95 mg/dL (ref 70–99)
Potassium: 3.8 mmol/L (ref 3.5–5.1)
Sodium: 137 mmol/L (ref 135–145)
Total Bilirubin: 0.5 mg/dL (ref 0.0–1.2)
Total Protein: 7.6 g/dL (ref 6.5–8.1)

## 2023-10-08 LAB — CBC WITH DIFFERENTIAL/PLATELET
Abs Immature Granulocytes: 0.02 10*3/uL (ref 0.00–0.07)
Basophils Absolute: 0 10*3/uL (ref 0.0–0.1)
Basophils Relative: 0 %
Eosinophils Absolute: 0.3 10*3/uL (ref 0.0–0.5)
Eosinophils Relative: 5 %
HCT: 41.5 % (ref 36.0–46.0)
Hemoglobin: 13 g/dL (ref 12.0–15.0)
Immature Granulocytes: 0 %
Lymphocytes Relative: 37 %
Lymphs Abs: 2.5 10*3/uL (ref 0.7–4.0)
MCH: 24.5 pg — ABNORMAL LOW (ref 26.0–34.0)
MCHC: 31.3 g/dL (ref 30.0–36.0)
MCV: 78.2 fL — ABNORMAL LOW (ref 80.0–100.0)
Monocytes Absolute: 0.6 10*3/uL (ref 0.1–1.0)
Monocytes Relative: 9 %
Neutro Abs: 3.3 10*3/uL (ref 1.7–7.7)
Neutrophils Relative %: 49 %
Platelets: 219 10*3/uL (ref 150–400)
RBC: 5.31 MIL/uL — ABNORMAL HIGH (ref 3.87–5.11)
RDW: 14 % (ref 11.5–15.5)
WBC: 6.8 10*3/uL (ref 4.0–10.5)
nRBC: 0 % (ref 0.0–0.2)

## 2023-10-08 LAB — URINALYSIS, ROUTINE W REFLEX MICROSCOPIC
Bilirubin Urine: NEGATIVE
Glucose, UA: NEGATIVE mg/dL
Ketones, ur: NEGATIVE mg/dL
Leukocytes,Ua: NEGATIVE
Nitrite: NEGATIVE
Protein, ur: NEGATIVE mg/dL
Specific Gravity, Urine: 1.034 — ABNORMAL HIGH (ref 1.005–1.030)
pH: 6 (ref 5.0–8.0)

## 2023-10-08 LAB — I-STAT CHEM 8, ED
BUN: 7 mg/dL (ref 6–20)
Calcium, Ion: 1.2 mmol/L (ref 1.15–1.40)
Chloride: 106 mmol/L (ref 98–111)
Creatinine, Ser: 1 mg/dL (ref 0.44–1.00)
Glucose, Bld: 89 mg/dL (ref 70–99)
HCT: 45 % (ref 36.0–46.0)
Hemoglobin: 15.3 g/dL — ABNORMAL HIGH (ref 12.0–15.0)
Potassium: 3.9 mmol/L (ref 3.5–5.1)
Sodium: 142 mmol/L (ref 135–145)
TCO2: 25 mmol/L (ref 22–32)

## 2023-10-08 LAB — I-STAT CG4 LACTIC ACID, ED: Lactic Acid, Venous: 1 mmol/L (ref 0.5–1.9)

## 2023-10-08 LAB — HCG, QUANTITATIVE, PREGNANCY: hCG, Beta Chain, Quant, S: <1 m[IU]/mL (ref <5)

## 2023-10-08 LAB — LIPASE, BLOOD: Lipase: 30 U/L (ref 11–51)

## 2023-10-08 MED ORDER — IBUPROFEN 800 MG PO TABS
800.0000 mg | ORAL_TABLET | Freq: Once | ORAL | Status: AC
  Administered 2023-10-08: 800 mg via ORAL
  Filled 2023-10-08: qty 1

## 2023-10-08 MED ORDER — LIDOCAINE 5 % EX PTCH: 1.0000 | MEDICATED_PATCH | CUTANEOUS | 0 refills | Status: AC

## 2023-10-08 MED ORDER — IBUPROFEN 800 MG PO TABS: 800.0000 mg | ORAL_TABLET | Freq: Three times a day (TID) | ORAL | 0 refills | Status: AC

## 2023-10-08 MED ORDER — IOHEXOL 300 MG/ML  SOLN
100.0000 mL | Freq: Once | INTRAMUSCULAR | Status: AC | PRN
  Administered 2023-10-08: 100 mL via INTRAVENOUS

## 2023-10-08 MED ORDER — AMLODIPINE BESYLATE 5 MG PO TABS
5.0000 mg | ORAL_TABLET | Freq: Once | ORAL | Status: AC
  Administered 2023-10-08: 5 mg via ORAL
  Filled 2023-10-08: qty 1

## 2023-10-08 NOTE — ED Provider Triage Note (Signed)
 Emergency Medicine Provider Triage Evaluation Note  Katherine Ward , a 48 y.o. female  was evaluated in triage.  Pt complains of right sided abdominal pain.  Review of Systems  Positive:  Negative:   Physical Exam  BP (!) 212/109   Pulse 78   Temp 97.7 F (36.5 C) (Oral)   Resp 18   LMP 09/20/2023 (Approximate)   SpO2 100%  Gen:   Awake, no distress   Resp:  Normal effort  MSK:   Moves extremities without difficulty  Other:    Medical Decision Making  Medically screening exam initiated at 12:48 PM.  Appropriate orders placed.  Katherine Ward was informed that the remainder of the evaluation will be completed by another provider, this initial triage assessment does not replace that evaluation, and the importance of remaining in the ED until their evaluation is complete.  Laparoscopic right sided hernia surgery in November. Pain has been present since, but increased in severity today.  Denies fever, nausea, vomiting, diarrhea, dysuria, hematuria, hematochezia.    Katherine Ward, [place] 10/08/23 1249

## 2023-10-08 NOTE — Discharge Instructions (Addendum)
 Your urine today shows that you are slightly dehydrated, but does not show any signs of infection.  Please increase your intake of water  at home.  We checked your electrolytes, kidney function, liver function, and pancreas labs which are all normal.  Your blood counts are normal.  It does not show any signs of infection or blood loss.  The CT scan you received earlier has no abnormalities to explain your pain.  It does show that you have a kidney stone in your left kidney, but it is not obstructing (meaning it should not be causing any pain).  Please follow-up with your surgical team for routine follow-up.  Your blood pressure was also slightly elevated today upon arrival.  As discussed, please take your blood pressures at home and bring this log with you to your next PCP appointment.  Please follow-up with your PCP within the next month for further blood pressure management.  Please take your amlodipine  5mg  twice daily.  Keep taking your olmesartan as prescribed.  You may use up to 800mg  ibuprofen  every 8 hours as needed for pain.  Do not exceed 2.4g of ibuprofen  per day.   Return the ER for any acute worsening or pain, severe nausea or vomiting, fever, inability to [place] stool, any other new or concerning symptoms.

## 2023-10-08 NOTE — ED Triage Notes (Signed)
 Pt presents with c/o post-op complication. Pt had hernia surgery on 11/21 and reports that she has had pain since the surgery off and on. The pain had been somewhat under control and she had gone back to work on light duty but within the last week, she has had pain in her right side/right lower abdomen and flank.

## 2023-10-08 NOTE — ED Provider Notes (Signed)
 [place] EMERGENCY DEPARTMENT AT [facility] Provider Note   CSN: [id number] Arrival date & time: 10/08/23  1556     History  Chief Complaint  Patient presents with   Abdominal Pain    Katherine Ward is a 48 y.o. female with history of hypertension, hernia repair on 08/22/2023, presents with concern for right lower quadrant pain that has been ongoing since 09/26/2023.  She denies any nausea, vomiting, fevers or chills.  Still having regular bowel movements.  Denies any dysuria, hematuria, increased frequency.  She was seen at [facility], ER earlier today and received CT and lab work, but did not stay for the results.   Abdominal Pain      Home Medications Prior to Admission medications   Medication Sig Start Date End Date Taking? Authorizing Provider  ibuprofen  (ADVIL ) 800 MG tablet Take 1 tablet (800 mg total) by mouth 3 (three) times daily. 10/08/23  Yes Veta Palma, PA-C  lidocaine  (LIDODERM ) 5 % Place 1 patch onto the skin daily. Remove & Discard patch within 12 hours or as directed by MD 10/08/23  Yes Veta Palma, PA-C  amLODipine  (NORVASC ) 5 MG tablet Take 1 tablet (5 mg total) by mouth daily. Patient taking differently: Take 5 mg by mouth in the morning and at bedtime. 10/13/15   Zackowski, Scott, MD  docusate sodium  (COLACE) 100 MG capsule Take 1 capsule (100 mg total) by mouth 2 (two) times daily. Patient not taking: Reported on 09/05/2023 08/22/23 08/21/24  Pappayliou, Dorothyann A, DO  hydrALAZINE  (APRESOLINE ) 50 MG tablet Take 1 tablet (50 mg total) by mouth 3 (three) times daily. Patient not taking: Reported on 08/15/2023 06/05/22 09/03/22  Custovic, Sabina, DO  olmesartan (BENICAR) 40 MG tablet Take 40 mg by mouth daily.    [provider]  oxyCODONE  (ROXICODONE ) 5 MG immediate release tablet Take 1 tablet (5 mg total) by mouth every 6 (six) hours as needed. Patient not taking: Reported on 09/05/2023 08/22/23   Pappayliou, Dorothyann A, DO       Allergies    Acetaminophen     Review of Systems   Review of Systems  Gastrointestinal:  Positive for abdominal pain.    Physical Exam Updated Vital Signs BP (!) 188/95 (BP Location: Right Arm)   Pulse 77   Temp 98.7 F (37.1 C) (Oral)   Resp 20   Ht 5' 4 (1.626 m)   Wt 78 kg   LMP 09/20/2023 (Approximate)   SpO2 100%   BMI 29.52 kg/m  Physical Exam Vitals and nursing note reviewed.  Constitutional:      General: She is not in acute distress.    Appearance: She is well-developed.  HENT:     Head: Normocephalic and atraumatic.  Eyes:     Conjunctiva/sclera: Conjunctivae normal.  Cardiovascular:     Rate and Rhythm: Normal rate and regular rhythm.     Heart sounds: No murmur heard. Pulmonary:     Effort: Pulmonary effort is normal. No respiratory distress.     Breath sounds: Normal breath sounds.  Abdominal:     Palpations: Abdomen is soft.     Tenderness: There is abdominal tenderness.     Comments: Tender to palpation in the right lower quadrant  Well-healed surgical scars of the abdomen present. No erythema  Musculoskeletal:        General: No swelling.     Cervical back: Neck supple.  Skin:    General: Skin is warm and dry.  Capillary Refill: Capillary refill takes less than 2 seconds.  Neurological:     Mental Status: She is alert.  Psychiatric:        Mood and Affect: Mood normal.     ED Results / Procedures / Treatments   Labs (all labs ordered are listed, but only abnormal results are displayed) Labs Reviewed  URINALYSIS, ROUTINE W REFLEX MICROSCOPIC - Abnormal; Notable for the following components:      Result Value   Color, Urine STRAW (*)    Specific Gravity, Urine 1.034 (*)    Hgb urine dipstick SMALL (*)    Bacteria, UA RARE (*)    All other components within normal limits    EKG None  Radiology CT ABDOMEN PELVIS W CONTRAST Result Date: 10/08/2023 CLINICAL DATA:  6 weeks postop from hernia repair. Persistent postop abdominal  pain. EXAM: CT ABDOMEN AND PELVIS WITH CONTRAST TECHNIQUE: Multidetector CT imaging of the abdomen and pelvis was performed using the standard protocol following bolus administration of intravenous contrast. RADIATION DOSE REDUCTION: This exam was performed according to the departmental dose-optimization program which includes automated exposure control, adjustment of the mA and/or kV according to patient size and/or use of iterative reconstruction technique. CONTRAST:  OMNIPAQUE  IOHEXOL  300 MG/ML  SOLN COMPARISON:  10/13/2015 FINDINGS: Lower Chest: No acute findings. Hepatobiliary: No suspicious hepatic masses identified. Gallbladder is unremarkable. No evidence of biliary ductal dilatation. Pancreas:  No mass or inflammatory changes. Spleen: Within normal limits in size and appearance. Adrenals/Urinary Tract: No suspicious masses identified. 1-2 mm calculus noted in upper pole of left kidney. No evidence of ureteral calculi or hydronephrosis. Stomach/Bowel: No evidence of obstruction, inflammatory process or abnormal fluid collections. Vascular/Lymphatic: No pathologically enlarged lymph nodes. No acute vascular findings. Reproductive:  No mass or other significant abnormality. Other:  None.  No evidence of recurrent hernia or abscess. Musculoskeletal:  No suspicious bone lesions identified. IMPRESSION: No acute findings. Tiny left renal calculus. No evidence of ureteral calculi or hydronephrosis. Electronically Signed   By: Norleen DELENA Kil M.D.   On: 10/08/2023 15:08    Procedures Procedures    Medications Ordered in ED Medications  amLODipine  (NORVASC ) tablet 5 mg (5 mg Oral Given 10/08/23 1642)  ibuprofen  (ADVIL ) tablet 800 mg (800 mg Oral Given 10/08/23 1753)    ED Course/ Medical Decision Making/ A&P                                 Medical Decision Making Amount and/or Complexity of Data Reviewed Labs: ordered.  Risk Prescription drug management.     Differential diagnosis includes  but is not limited to surgical site infection, cholelithiasis, cholangitis, choledocholithiasis, peptic ulcer, gastritis, gastroenteritis, appendicitis, IBS, IBD, DKA, nephrolithiasis, UTI, pyelonephritis, pancreatitis, diverticulitis, mesenteric ischemia, abdominal aortic aneurysm, small bowel obstruction, volvulus, testicular torsion, ovarian torsion, and females of childbearing age pregnancy   ED Course:  Patient well-appearing, stable vital signs aside from a elevated blood pressure on arrival at 192/101.  She reports right lower quadrant pain ongoing for about the past 2 weeks.  No acute worsening of her pain today.  She has been using ibuprofen  at home which helps some with the pain.  I reviewed her workup from [facility] earlier today.  CBC without leukocytosis, CMP with no elevation LFTs, no electrolyte abnormalities.  Pregnancy negative.  Lipase within normal limits.  Urinalysis was not obtained earlier, I had patient provide sample here.  This shows she is slightly dehydrated with high specific gravity, but no signs of infection.  CT abdomen pelvis taken earlier today was also reviewed.  No acute abnormalities to explain patient's pain.  Low concern for any emergent etiology at this time.  Patient was given ibuprofen  here for pain.  Was also given 5 mg of her home amlodipine  for blood pressure.  Patient stable and appropriate for discharge home this time.    Impression: Right lower quadrant abdominal pain  Disposition:  The patient was discharged home with instructions to follow up with her surgical team for routine postop visit and PCP for further management of blood pressure within the next month. I discussed increasing her dose of amlodipine  to 5 mg BID and staying on the same olmesartan dose.  Discussed taking her blood pressures daily at home and bringing a log to her PCP appointment.  Prescription sent for ibuprofen  to her pharmacy. Return precautions given.  Imaging Studies  ordered: I ordered imaging studies including CT abdomen pelvis I independently visualized the imaging with scope of interpretation limited to determining acute life threatening conditions related to emergency care. Imaging showed acute abnormalities.  Left-sided nonobstructing renal stone I agree with the radiologist interpretation               Final Clinical Impression(s) / ED Diagnoses Final diagnoses:  Right lower quadrant abdominal pain    Rx / DC Orders ED Discharge Orders          Ordered    ibuprofen  (ADVIL ) 800 MG tablet  3 times daily        10/08/23 1743    lidocaine  (LIDODERM ) 5 %  Every 24 hours        10/08/23 1743              Veta Palma, PA-C 10/08/23 1942    Yolande Lamar BROCKS, MD 10/15/23 1156

## 2023-10-08 NOTE — ED Triage Notes (Signed)
 Pt c/o R side abdominal pain since 12/26.  Pain score 9/10.  Pt was seen at [facility] and had blood work and a CT completed, but left prior to being roomed and getting her results.  Pt still has IV in place.  Pt had hernia surgery in late November.     Pt reports taking home medications between leaving [facility] and coming here.

## 2023-10-08 NOTE — ED Notes (Signed)
 Pt verbalized understanding of discharge instructions. Opportunity for questions provided.

## 2024-01-08 DIAGNOSIS — N920 Excessive and frequent menstruation with regular cycle: Secondary | ICD-10-CM | POA: Diagnosis not present

## 2024-01-08 DIAGNOSIS — I10 Essential (primary) hypertension: Secondary | ICD-10-CM | POA: Diagnosis not present

## 2024-05-29 DIAGNOSIS — R739 Hyperglycemia, unspecified: Secondary | ICD-10-CM | POA: Diagnosis not present

## 2024-05-29 DIAGNOSIS — Z0001 Encounter for general adult medical examination with abnormal findings: Secondary | ICD-10-CM | POA: Diagnosis not present

## 2024-05-29 DIAGNOSIS — R5383 Other fatigue: Secondary | ICD-10-CM | POA: Diagnosis not present

## 2024-05-29 DIAGNOSIS — E559 Vitamin D deficiency, unspecified: Secondary | ICD-10-CM | POA: Diagnosis not present

## 2024-05-29 DIAGNOSIS — I10 Essential (primary) hypertension: Secondary | ICD-10-CM | POA: Diagnosis not present

## 2024-05-29 DIAGNOSIS — E782 Mixed hyperlipidemia: Secondary | ICD-10-CM | POA: Diagnosis not present

## 2024-05-29 DIAGNOSIS — E538 Deficiency of other specified B group vitamins: Secondary | ICD-10-CM | POA: Diagnosis not present

## 2024-06-24 DIAGNOSIS — I10 Essential (primary) hypertension: Secondary | ICD-10-CM | POA: Diagnosis not present
